# Patient Record
Sex: Female | Born: 1970 | Race: White | Hispanic: No | Marital: Married | Smoking: Former smoker
Health system: Southern US, Community
[De-identification: ages and names within clinical notes are randomized; demographics above are authoritative.]

## PROBLEM LIST (undated history)

## (undated) DIAGNOSIS — K900 Celiac disease: Secondary | ICD-10-CM

## (undated) DIAGNOSIS — D649 Anemia, unspecified: Secondary | ICD-10-CM

## (undated) DIAGNOSIS — J45909 Unspecified asthma, uncomplicated: Secondary | ICD-10-CM

## (undated) DIAGNOSIS — N189 Chronic kidney disease, unspecified: Secondary | ICD-10-CM

## (undated) HISTORY — PX: AUGMENTATION MAMMAPLASTY: SUR837

## (undated) HISTORY — PX: ABDOMINAL HYSTERECTOMY: SHX81

## (undated) HISTORY — PX: OTHER SURGICAL HISTORY: SHX169

---

## 2004-04-14 ENCOUNTER — Ambulatory Visit: Payer: Self-pay | Admitting: Internal Medicine

## 2004-04-29 ENCOUNTER — Ambulatory Visit: Payer: Self-pay | Admitting: Internal Medicine

## 2004-04-30 ENCOUNTER — Ambulatory Visit: Payer: Self-pay | Admitting: Internal Medicine

## 2006-06-10 ENCOUNTER — Ambulatory Visit: Payer: Self-pay | Admitting: Obstetrics and Gynecology

## 2006-06-17 ENCOUNTER — Ambulatory Visit: Payer: Self-pay | Admitting: Obstetrics and Gynecology

## 2007-03-06 ENCOUNTER — Ambulatory Visit: Payer: Self-pay | Admitting: Internal Medicine

## 2007-03-07 ENCOUNTER — Emergency Department: Payer: Self-pay | Admitting: Internal Medicine

## 2008-05-06 ENCOUNTER — Ambulatory Visit: Payer: Self-pay | Admitting: Internal Medicine

## 2008-05-13 ENCOUNTER — Ambulatory Visit: Payer: Self-pay | Admitting: Internal Medicine

## 2008-11-27 ENCOUNTER — Ambulatory Visit: Payer: Self-pay | Admitting: Internal Medicine

## 2010-04-03 ENCOUNTER — Ambulatory Visit: Payer: Self-pay | Admitting: Internal Medicine

## 2010-04-09 ENCOUNTER — Ambulatory Visit: Payer: Self-pay | Admitting: Internal Medicine

## 2010-04-12 ENCOUNTER — Ambulatory Visit: Payer: Self-pay | Admitting: Internal Medicine

## 2011-08-12 ENCOUNTER — Ambulatory Visit: Payer: Self-pay | Admitting: Internal Medicine

## 2012-02-02 ENCOUNTER — Ambulatory Visit: Payer: Self-pay | Admitting: Internal Medicine

## 2012-03-30 ENCOUNTER — Ambulatory Visit: Payer: Self-pay | Admitting: Obstetrics and Gynecology

## 2012-03-30 LAB — BASIC METABOLIC PANEL
Anion Gap: 5 — ABNORMAL LOW (ref 7–16)
BUN: 10 mg/dL (ref 7–18)
Calcium, Total: 8.6 mg/dL (ref 8.5–10.1)
Chloride: 111 mmol/L — ABNORMAL HIGH (ref 98–107)
Co2: 25 mmol/L (ref 21–32)
Creatinine: 0.72 mg/dL (ref 0.60–1.30)
EGFR (African American): >60
EGFR (Non-African Amer.): >60
Glucose: 83 mg/dL (ref 65–99)
Osmolality: 279 (ref 275–301)
Potassium: 4.2 mmol/L (ref 3.5–5.1)
Sodium: 141 mmol/L (ref 136–145)

## 2012-03-30 LAB — HEMOGLOBIN: HGB: 13.5 g/dL (ref 12.0–16.0)

## 2012-03-30 LAB — PREGNANCY, URINE: Pregnancy Test, Urine: NEGATIVE m[IU]/mL

## 2012-04-10 ENCOUNTER — Ambulatory Visit: Payer: Self-pay | Admitting: Obstetrics and Gynecology

## 2012-04-11 LAB — BASIC METABOLIC PANEL
Anion Gap: 5 — ABNORMAL LOW (ref 7–16)
BUN: 7 mg/dL (ref 7–18)
Calcium, Total: 7.6 mg/dL — ABNORMAL LOW (ref 8.5–10.1)
Chloride: 109 mmol/L — ABNORMAL HIGH (ref 98–107)
Co2: 23 mmol/L (ref 21–32)
Creatinine: 0.62 mg/dL (ref 0.60–1.30)
EGFR (African American): >60
EGFR (Non-African Amer.): >60
Glucose: 107 mg/dL — ABNORMAL HIGH (ref 65–99)
Osmolality: 278 (ref 275–301)
Potassium: 4.3 mmol/L (ref 3.5–5.1)
Sodium: 140 mmol/L (ref 136–145)

## 2012-04-11 LAB — HEMATOCRIT: HCT: 33.4 % — ABNORMAL LOW (ref 35.0–47.0)

## 2012-04-11 LAB — PATHOLOGY REPORT

## 2012-08-29 ENCOUNTER — Ambulatory Visit: Payer: Self-pay | Admitting: Internal Medicine

## 2012-11-01 ENCOUNTER — Emergency Department: Payer: Self-pay | Admitting: Internal Medicine

## 2012-11-01 LAB — HEPATIC FUNCTION PANEL A (ARMC)
Albumin: 3.5 g/dL (ref 3.4–5.0)
Alkaline Phosphatase: 87 U/L (ref 50–136)
Bilirubin, Direct: <0.1 mg/dL (ref 0.00–0.20)
Bilirubin,Total: 0.3 mg/dL (ref 0.2–1.0)
SGOT(AST): 26 U/L (ref 15–37)
SGPT (ALT): 25 U/L (ref 12–78)
Total Protein: 6.5 g/dL (ref 6.4–8.2)

## 2012-11-01 LAB — BASIC METABOLIC PANEL
Anion Gap: 6 — ABNORMAL LOW (ref 7–16)
BUN: 12 mg/dL (ref 7–18)
Calcium, Total: 8.5 mg/dL (ref 8.5–10.1)
Chloride: 109 mmol/L — ABNORMAL HIGH (ref 98–107)
Co2: 25 mmol/L (ref 21–32)
Creatinine: 0.79 mg/dL (ref 0.60–1.30)
EGFR (African American): >60
EGFR (Non-African Amer.): >60
Glucose: 87 mg/dL (ref 65–99)
Osmolality: 279 (ref 275–301)
Potassium: 3.7 mmol/L (ref 3.5–5.1)
Sodium: 140 mmol/L (ref 136–145)

## 2012-11-01 LAB — CBC
HCT: 42 % (ref 35.0–47.0)
HGB: 14.3 g/dL (ref 12.0–16.0)
MCH: 28.9 pg (ref 26.0–34.0)
MCHC: 33.9 g/dL (ref 32.0–36.0)
MCV: 85 fL (ref 80–100)
Platelet: 259 10*3/uL (ref 150–440)
RBC: 4.93 10*6/uL (ref 3.80–5.20)
RDW: 14.8 % — ABNORMAL HIGH (ref 11.5–14.5)
WBC: 6.3 10*3/uL (ref 3.6–11.0)

## 2012-11-01 LAB — LIPASE, BLOOD: Lipase: 185 U/L (ref 73–393)

## 2012-11-01 LAB — TROPONIN I
Troponin-I: <0.02 ng/mL
Troponin-I: <0.02 ng/mL

## 2013-10-24 ENCOUNTER — Ambulatory Visit: Payer: Self-pay | Admitting: Internal Medicine

## 2014-06-28 ENCOUNTER — Ambulatory Visit: Payer: Self-pay | Admitting: Physician Assistant

## 2014-07-30 NOTE — Op Note (Signed)
PATIENT NAME:  Jillian Stafford, Jillian Stafford MR#:  [id number] DATE OF BIRTH:  [birth year]  DATE OF PROCEDURE:  04/10/2012  PREOPERATIVE DIAGNOSIS: Menorrhagia.   POSTOPERATIVE DIAGNOSIS:  Menorrhagia.  PROCEDURE:  Laparoscopic bilateral salpingectomy, laparoscopic supracervical hysterectomy.   ANESTHESIA: General endotracheal anesthesia.   SURGEON: Laverta Baltimore, MD  FIRST ASSISTANTAmalia Hailey.  INDICATIONS: This is a 44 year old, gravida 2, para 1 patient with a long history of menorrhagia.   DESCRIPTION OF PROCEDURE: After adequate general endotracheal anesthesia, the patient was placed in dorsal supine position with the legs in the [place] stirrups. Abdomen, perineum and vagina prepped in normal sterile fashion. The patient did receive 2 grams of cefoxitin prior to commencement of the case. Speculum placed in the vagina and the anterior cervix grasped with single-tooth tenaculum and a uterine sound placed in the endometrium and the sound and the single-tooth tenaculum were secured with Steri-Strips.   Gloves were changed and attention directed to patient's abdomen. A 12 mm infraumbilical incision was made. The laparoscope was then advanced into the abdominal cavity under direct  visualization with the Optiview cannula. The patient was placed in Trendelenburg, second port was placed and 3 cm medial to the left anterior iliac spine a #11 port was advanced into the abdominal cavity with direct visualization. A third port placed in a similar position on the right side after injecting with 0.5% Sensorcaine. Attention was directed to the patient's left fallopian tube which was grasped with the fimbriated end and using the Harmonic scalpel the left fallopian tube was removed without difficulty. A similar procedure was repeated on the patient's right fallopian tube. Good hemostasis noted. The uterus was grasped with a tenaculum and the round ligaments were bilaterally clamped, transected with the Harmonic scalpel and  the utero-ovarian ligaments were bilaterally clamped, transected with the Harmonic scalpel. Broad ligament was dissected and each uterine artery was then coagulated with the Kleppingers and transected. Cervix was then transected with the Harmonic scalpel after removing the uterine sound. The endocervical stump was then cauterized with Kleppingers and the rim of the cervical bed also was cauterized to assure good hemostasis. The morcellator was then brought into the abdominal cavity under direct visualization and the uterus was morcellated in the standard fashion. The patient's abdomen was copiously irrigated and suctioned. Good hemostasis noted. Each ureter showed normal peristaltic activity bilaterally. Upper abdomen appear normal. The patient's abdomen was reinspected. The pressure was lowered to 8 mmHg and good hemostasis again was noted. All instruments were then removed after deflating the patient's abdomen.   The incisions were closed with 2 layers for the fascial layer, 2-0 Vicryl suture, and all skin incisions were closed with interrupted 4-0 Vicryl suture. Sterile dressings were applied. Single-tooth tenaculum was then removed and the cervix had a small ooze at the tenaculum site which was controlled with silver nitrate. There were no complications.  Estimated blood 75 mL, intraoperative 1800 mL, urine output 100 mL. Of note, a Foley catheter was placed at commencement of the case and will remain postoperatively.    ____________________________ Boykin Nearing, MD tjs:cs D: 04/10/2012 11:59:00 ET T: 04/10/2012 14:38:03 ET JOB#: [id number]  cc: Boykin Nearing, MD, <Dictator> Boykin Nearing MD ELECTRONICALLY SIGNED 04/10/2012 22:25

## 2014-07-30 NOTE — Discharge Summary (Signed)
PATIENT NAME:  Jillian Stafford, Jillian Stafford MR#:  [id number] DATE OF BIRTH:  10/14/1970  DATE OF ADMISSION:  04/10/2012 DATE OF DISCHARGE:  04/11/2012  PRINCIPAL PROCEDURE: Laparoscopic supracervical hysterectomy with bilateral salpingectomy.   HOSPITAL COURSE: The patient underwent the above procedure which was uncomplicated. On day of surgery, the patient demonstrated decreased urine output but responded well to intravenous fluid bolus. Postop day #1, the patient's vital signs stable, good urine output, hematocrit 33.4%, BUN and creatinine of 7 and 0.62. The patient was without complaints. Dressings were dry. The patient is in adequate pain relief. The patient is discharged to home in good condition.   DISCHARGE MEDICATIONS: Norco 5/325 one to two tablets every 4 hours as needed for pain, Naprosyn 500 mg twice a day as needed for pain, Colace 100 mg daily as needed.   DISCHARGE INSTRUCTIONS: The patient will refrain from sexual activity 30 days. She will follow up with Dr. Ouida Sills in 2 weeks for wound care. The patient is instructed to return before that time if she has increasing nausea or vomiting, increasing abdominal pain, fever, or heavy vaginal bleeding.     ____________________________ Boykin Nearing, MD tjs:es D: 04/11/2012 07:11:33 ET T: 04/11/2012 10:40:25 ET JOB#: [id number]  cc: Boykin Nearing, MD, <Dictator> Boykin Nearing MD ELECTRONICALLY SIGNED 04/11/2012 20:33

## 2014-10-18 ENCOUNTER — Telehealth (HOSPITAL_COMMUNITY): Payer: Self-pay | Admitting: *Deleted

## 2014-10-18 ENCOUNTER — Other Ambulatory Visit: Payer: Self-pay | Admitting: Physician Assistant

## 2014-10-18 DIAGNOSIS — M79601 Pain in right arm: Secondary | ICD-10-CM

## 2014-10-18 DIAGNOSIS — M5030 Other cervical disc degeneration, unspecified cervical region: Secondary | ICD-10-CM

## 2014-10-18 DIAGNOSIS — R202 Paresthesia of skin: Secondary | ICD-10-CM

## 2014-10-18 DIAGNOSIS — M79602 Pain in left arm: Secondary | ICD-10-CM

## 2014-10-24 ENCOUNTER — Ambulatory Visit
Admission: RE | Admit: 2014-10-24 | Discharge: 2014-10-24 | Disposition: A | Discharge status: Home / Self Care | Payer: BLUE CROSS/BLUE SHIELD | Source: Ambulatory Visit | Attending: Physician Assistant | Admitting: Physician Assistant

## 2014-10-24 DIAGNOSIS — M47892 Other spondylosis, cervical region: Secondary | ICD-10-CM | POA: Diagnosis not present

## 2014-10-24 DIAGNOSIS — R202 Paresthesia of skin: Secondary | ICD-10-CM | POA: Diagnosis present

## 2014-10-24 DIAGNOSIS — M79601 Pain in right arm: Secondary | ICD-10-CM | POA: Diagnosis present

## 2014-10-24 DIAGNOSIS — M5022 Other cervical disc displacement, mid-cervical region: Secondary | ICD-10-CM | POA: Diagnosis not present

## 2014-10-24 DIAGNOSIS — M2578 Osteophyte, vertebrae: Secondary | ICD-10-CM | POA: Diagnosis not present

## 2014-10-24 DIAGNOSIS — M79602 Pain in left arm: Secondary | ICD-10-CM

## 2014-10-24 DIAGNOSIS — M4802 Spinal stenosis, cervical region: Secondary | ICD-10-CM | POA: Insufficient documentation

## 2014-10-24 DIAGNOSIS — M5030 Other cervical disc degeneration, unspecified cervical region: Secondary | ICD-10-CM

## 2014-10-24 MED ORDER — GADOBENATE DIMEGLUMINE 529 MG/ML IV SOLN
15.0000 mL | Freq: Once | INTRAVENOUS | Status: AC | PRN
  Administered 2014-10-24: 15 mL via INTRAVENOUS

## 2014-11-25 ENCOUNTER — Other Ambulatory Visit: Payer: Self-pay | Admitting: Neurology

## 2014-11-25 DIAGNOSIS — G629 Polyneuropathy, unspecified: Secondary | ICD-10-CM

## 2014-11-29 ENCOUNTER — Ambulatory Visit: Payer: BLUE CROSS/BLUE SHIELD

## 2014-12-05 ENCOUNTER — Other Ambulatory Visit: Payer: Self-pay | Admitting: Internal Medicine

## 2014-12-05 DIAGNOSIS — Z1231 Encounter for screening mammogram for malignant neoplasm of breast: Secondary | ICD-10-CM

## 2014-12-10 ENCOUNTER — Ambulatory Visit
Admission: RE | Admit: 2014-12-10 | Discharge: 2014-12-10 | Disposition: A | Discharge status: Home / Self Care | Payer: BLUE CROSS/BLUE SHIELD | Source: Ambulatory Visit | Attending: Internal Medicine | Admitting: Internal Medicine

## 2014-12-10 ENCOUNTER — Other Ambulatory Visit: Payer: Self-pay | Admitting: Internal Medicine

## 2014-12-10 DIAGNOSIS — Z9882 Breast implant status: Secondary | ICD-10-CM | POA: Diagnosis not present

## 2014-12-10 DIAGNOSIS — Z1231 Encounter for screening mammogram for malignant neoplasm of breast: Secondary | ICD-10-CM | POA: Insufficient documentation

## 2014-12-12 ENCOUNTER — Other Ambulatory Visit: Payer: Self-pay | Admitting: Internal Medicine

## 2014-12-12 DIAGNOSIS — R928 Other abnormal and inconclusive findings on diagnostic imaging of breast: Secondary | ICD-10-CM

## 2014-12-12 DIAGNOSIS — N6489 Other specified disorders of breast: Secondary | ICD-10-CM

## 2014-12-13 ENCOUNTER — Ambulatory Visit
Admission: RE | Admit: 2014-12-13 | Discharge: 2014-12-13 | Disposition: A | Discharge status: Home / Self Care | Payer: BLUE CROSS/BLUE SHIELD | Source: Ambulatory Visit | Attending: Internal Medicine | Admitting: Internal Medicine

## 2014-12-13 ENCOUNTER — Other Ambulatory Visit: Payer: Self-pay | Admitting: Internal Medicine

## 2014-12-13 DIAGNOSIS — R928 Other abnormal and inconclusive findings on diagnostic imaging of breast: Secondary | ICD-10-CM

## 2014-12-13 DIAGNOSIS — Z9882 Breast implant status: Secondary | ICD-10-CM | POA: Diagnosis not present

## 2014-12-13 DIAGNOSIS — N6489 Other specified disorders of breast: Secondary | ICD-10-CM

## 2015-01-09 ENCOUNTER — Encounter: Payer: Self-pay | Admitting: *Deleted

## 2015-01-10 ENCOUNTER — Ambulatory Visit: Payer: BLUE CROSS/BLUE SHIELD | Admitting: Anesthesiology

## 2015-01-10 ENCOUNTER — Encounter
Admission: RE | Disposition: A | Discharge status: Home / Self Care | Payer: Self-pay | Source: Ambulatory Visit | Attending: Gastroenterology

## 2015-01-10 ENCOUNTER — Ambulatory Visit
Admission: RE | Admit: 2015-01-10 | Discharge: 2015-01-10 | Disposition: A | Discharge status: Home / Self Care | Payer: BLUE CROSS/BLUE SHIELD | Source: Ambulatory Visit | Attending: Gastroenterology | Admitting: Gastroenterology

## 2015-01-10 ENCOUNTER — Encounter: Payer: Self-pay | Admitting: Anesthesiology

## 2015-01-10 DIAGNOSIS — K900 Celiac disease: Secondary | ICD-10-CM | POA: Diagnosis not present

## 2015-01-10 DIAGNOSIS — K219 Gastro-esophageal reflux disease without esophagitis: Secondary | ICD-10-CM | POA: Diagnosis present

## 2015-01-10 DIAGNOSIS — K449 Diaphragmatic hernia without obstruction or gangrene: Secondary | ICD-10-CM | POA: Insufficient documentation

## 2015-01-10 DIAGNOSIS — K529 Noninfective gastroenteritis and colitis, unspecified: Secondary | ICD-10-CM | POA: Diagnosis not present

## 2015-01-10 DIAGNOSIS — Z803 Family history of malignant neoplasm of breast: Secondary | ICD-10-CM | POA: Diagnosis not present

## 2015-01-10 DIAGNOSIS — Z833 Family history of diabetes mellitus: Secondary | ICD-10-CM | POA: Insufficient documentation

## 2015-01-10 DIAGNOSIS — K210 Gastro-esophageal reflux disease with esophagitis: Secondary | ICD-10-CM | POA: Insufficient documentation

## 2015-01-10 DIAGNOSIS — D649 Anemia, unspecified: Secondary | ICD-10-CM | POA: Diagnosis not present

## 2015-01-10 DIAGNOSIS — K2210 Ulcer of esophagus without bleeding: Secondary | ICD-10-CM | POA: Insufficient documentation

## 2015-01-10 DIAGNOSIS — N189 Chronic kidney disease, unspecified: Secondary | ICD-10-CM | POA: Diagnosis not present

## 2015-01-10 DIAGNOSIS — Z79899 Other long term (current) drug therapy: Secondary | ICD-10-CM | POA: Insufficient documentation

## 2015-01-10 DIAGNOSIS — H9192 Unspecified hearing loss, left ear: Secondary | ICD-10-CM | POA: Insufficient documentation

## 2015-01-10 DIAGNOSIS — Z881 Allergy status to other antibiotic agents status: Secondary | ICD-10-CM | POA: Insufficient documentation

## 2015-01-10 DIAGNOSIS — K2950 Unspecified chronic gastritis without bleeding: Secondary | ICD-10-CM | POA: Diagnosis not present

## 2015-01-10 DIAGNOSIS — Z8249 Family history of ischemic heart disease and other diseases of the circulatory system: Secondary | ICD-10-CM | POA: Diagnosis not present

## 2015-01-10 DIAGNOSIS — Z87442 Personal history of urinary calculi: Secondary | ICD-10-CM | POA: Insufficient documentation

## 2015-01-10 DIAGNOSIS — J45909 Unspecified asthma, uncomplicated: Secondary | ICD-10-CM | POA: Diagnosis not present

## 2015-01-10 HISTORY — DX: Chronic kidney disease, unspecified: N18.9

## 2015-01-10 HISTORY — PX: COLONOSCOPY WITH PROPOFOL: SHX5780

## 2015-01-10 HISTORY — DX: Anemia, unspecified: D64.9

## 2015-01-10 HISTORY — PX: ESOPHAGOGASTRODUODENOSCOPY (EGD) WITH PROPOFOL: SHX5813

## 2015-01-10 HISTORY — DX: Unspecified asthma, uncomplicated: J45.909

## 2015-01-10 HISTORY — DX: Celiac disease: K90.0

## 2015-01-10 SURGERY — COLONOSCOPY WITH PROPOFOL
Anesthesia: General

## 2015-01-10 MED ORDER — SODIUM CHLORIDE 0.9 % IV SOLN
INTRAVENOUS | Status: DC
  Administered 2015-01-10: 1000 mL via INTRAVENOUS
  Administered 2015-01-10: 12:00:00 via INTRAVENOUS

## 2015-01-10 MED ORDER — SODIUM CHLORIDE 0.9 % IV SOLN: INTRAVENOUS | Status: DC

## 2015-01-10 MED ORDER — IPRATROPIUM-ALBUTEROL 0.5-2.5 (3) MG/3ML IN SOLN
RESPIRATORY_TRACT | Status: AC
  Administered 2015-01-10: 3 mL
  Filled 2015-01-10: qty 3

## 2015-01-10 MED ORDER — IPRATROPIUM-ALBUTEROL 0.5-2.5 (3) MG/3ML IN SOLN: 3.0000 mL | Freq: Once | RESPIRATORY_TRACT | Status: DC

## 2015-01-10 MED ORDER — PROPOFOL 500 MG/50ML IV EMUL
INTRAVENOUS | Status: DC | PRN
  Administered 2015-01-10: 120 ug/kg/min via INTRAVENOUS

## 2015-01-10 MED ORDER — PHENYLEPHRINE HCL 10 MG/ML IJ SOLN
INTRAMUSCULAR | Status: DC | PRN
  Administered 2015-01-10: 200 ug via INTRAVENOUS
  Administered 2015-01-10 (×2): 100 ug via INTRAVENOUS
  Administered 2015-01-10: 200 ug via INTRAVENOUS
  Administered 2015-01-10 (×2): 100 ug via INTRAVENOUS

## 2015-01-10 MED ORDER — PROPOFOL 10 MG/ML IV BOLUS
INTRAVENOUS | Status: DC | PRN
  Administered 2015-01-10 (×3): 50 mg via INTRAVENOUS

## 2015-01-10 NOTE — Anesthesia Postprocedure Evaluation (Signed)
  Anesthesia Post-op Note  Patient: Jillian Stafford  Procedure(s) Performed: Procedure(s): COLONOSCOPY WITH PROPOFOL (N/A) ESOPHAGOGASTRODUODENOSCOPY (EGD) WITH PROPOFOL (N/A)  Anesthesia type:General  Patient location: PACU  Post pain: Pain level controlled  Post assessment: Post-op Vital signs reviewed, Patient's Cardiovascular Status Stable, Respiratory Function Stable, Patent Airway and No signs of Nausea or vomiting  Post vital signs: Reviewed and stable  Last Vitals:  Filed Vitals:   01/10/15 1227  BP: 102/79  Pulse: 79  Temp:   Resp: 14    Level of consciousness: awake, alert  and patient cooperative  Complications: No apparent anesthesia complications

## 2015-01-10 NOTE — Op Note (Signed)
[facility] Gastroenterology Patient Name: Jillian Stafford Procedure Date: 01/10/2015 11:02 AM MRN: [id number] Account #: [id number] Date of Birth: 03-13-1971 Admit Type: Outpatient Age: 44 Room: [facility] ENDO ROOM 3 Gender: Female Note Status: Finalized Procedure:         Upper GI endoscopy Indications:       Follow-up of celiac disease Providers:         Lollie Sails, MD Referring MD:      Glendon Axe (Referring MD) Medicines:         Monitored Anesthesia Care Complications:     No immediate complications. Procedure:         Pre-Anesthesia Assessment:                    - ASA Grade Assessment: II - A patient with mild systemic                     disease.                    After obtaining informed consent, the endoscope was passed                     under direct vision. Throughout the procedure, the                     patient's blood pressure, pulse, and oxygen saturations                     were monitored continuously. The Endoscope was introduced                     through the mouth, and advanced to the fourth part of                     duodenum. The upper GI endoscopy was accomplished without                     difficulty. The patient tolerated the procedure well. Findings:      LA Grade A (one or more mucosal breaks less than 5 mm, not extending       between tops of 2 mucosal folds) esophagitis was found. Biopsies were       taken with a cold forceps for histology.      The exam of the esophagus was otherwise normal.      Diffuse mildly erythematous mucosa without bleeding was found in the       gastric body. Biopsies were taken with a cold forceps for histology.      A small hiatus hernia was found. The Z-line was a variable distance from       incisors; the hiatal hernia was sliding.      Diffuse mucosal flattening was found in the entire examined duodenum.       Biopsies for histology were taken with a cold forceps for for evaluation       of  celiac disease. Impression:        - LA Grade A erosive esophagitis. Biopsied.                    - Erythematous mucosa in the gastric body. Biopsied.                    - Small hiatus hernia.                    -  Flattened mucosa was found in the duodenum, consistent                     with celiac disease. Biopsied. Recommendation:    - Use Prilosec (omeprazole) 20 mg PO daily daily.                    - Await pathology results.                    - Return to GI clinic in 4 weeks. Procedure Code(s): --- Professional ---                    [phone], Esophagogastroduodenoscopy, flexible, transoral;                     with biopsy, single or multiple Diagnosis Code(s): --- Professional ---                    530.19, Other esophagitis                    537.89, Other specified disorders of stomach and duodenum                    537.9, Unspecified disorder of stomach and duodenum                    579.0, Celiac disease                    553.3, Diaphragmatic hernia without mention of obstruction                     or gangrene CPT copyright 2014 American Medical Association. All rights reserved. The codes documented in this report are preliminary and upon coder review may  be revised to meet current compliance requirements. Lollie Sails, MD 01/10/2015 11:24:31 AM This report has been signed electronically. Number of Addenda: 0 Note Initiated On: 01/10/2015 11:02 AM      [facility]

## 2015-01-10 NOTE — Op Note (Signed)
[facility] Gastroenterology Patient Name: Jillian Stafford Procedure Date: 01/10/2015 11:01 AM MRN: [id number] Account #: [id number] Date of Birth: 17-May-1970 Admit Type: Outpatient Age: 44 Room: [facility] ENDO ROOM 3 Gender: Female Note Status: Finalized Procedure:         Colonoscopy Indications:       Chronic diarrhea Providers:         Lollie Sails, MD Referring MD:      Glendon Axe (Referring MD) Medicines:         Monitored Anesthesia Care Complications:     No immediate complications. Procedure:         Pre-Anesthesia Assessment:                    - ASA Grade Assessment: II - A patient with mild systemic                     disease.                    After obtaining informed consent, the colonoscope was                     passed under direct vision. Throughout the procedure, the                     patient's blood pressure, pulse, and oxygen saturations                     were monitored continuously. The Colonoscope was                     introduced through the anus and advanced to the the cecum,                     identified by appendiceal orifice and ileocecal valve. The                     colonoscopy was unusually difficult due to poor bowel                     prep. Successful completion of the procedure was aided by                     lavage. The patient tolerated the procedure well. Findings:      The colon (entire examined portion) appeared normal. Biopsies for       histology were taken with a cold forceps from the right colon and left       colon for evaluation of microscopic colitis.      The digital rectal exam was normal. Impression:        - The entire examined colon is normal. Biopsied. Recommendation:    - Discharge patient to home. Procedure Code(s): --- Professional ---                    [phone], Colonoscopy, flexible; with biopsy, single or                     multiple Diagnosis Code(s): --- Professional ---  787.91, Diarrhea CPT copyright 2014 American Medical Association. All rights reserved. The codes documented in this report are preliminary and upon coder review may  be revised to meet current compliance requirements. Lollie Sails, MD 01/10/2015 11:53:37 AM This report has been signed electronically. Number of Addenda:  0 Note Initiated On: 01/10/2015 11:01 AM Scope Withdrawal Time: 0 hours 9 minutes 2 seconds  Total Procedure Duration: 0 hours 24 minutes 15 seconds       [facility]

## 2015-01-10 NOTE — Transfer of Care (Signed)
Immediate Anesthesia Transfer of Care Note  Patient: Jillian Stafford  Procedure(s) Performed: Procedure(s): COLONOSCOPY WITH PROPOFOL (N/A) ESOPHAGOGASTRODUODENOSCOPY (EGD) WITH PROPOFOL (N/A)  Patient Location: PACU  Anesthesia Type:General  Level of Consciousness: awake, alert  and oriented  Airway & Oxygen Therapy: Patient Spontanous Breathing and Patient connected to nasal cannula oxygen  Post-op Assessment: Post -op Vital signs reviewed and stable  Post vital signs: Reviewed  Last Vitals:  Filed Vitals:   01/10/15 1008  BP: 97/67  Pulse: 90  Temp: 36.5 C  Resp: 16    Complications: No apparent anesthesia complications

## 2015-01-10 NOTE — Anesthesia Preprocedure Evaluation (Addendum)
Anesthesia Evaluation  Patient identified by MRN, date of birth, ID band Patient awake    Reviewed: Allergy & Precautions, H&P , NPO status , Patient's Chart, lab work & pertinent test results  History of Anesthesia Complications (+) PROLONGED EMERGENCE and history of anesthetic complications  Airway Mallampati: II  TM Distance: >3 FB Neck ROM: full    Dental no notable dental hx. (+) Teeth Intact   Pulmonary neg shortness of breath, asthma ,     + wheezing      Cardiovascular Exercise Tolerance: Good Normal cardiovascular exam Rhythm:regular Rate:Normal     Neuro/Psych negative neurological ROS  negative psych ROS   GI/Hepatic negative GI ROS, Neg liver ROS,   Endo/Other  negative endocrine ROS  Renal/GU Renal disease  negative genitourinary   Musculoskeletal   Abdominal   Peds  Hematology negative hematology ROS (+) anemia ,   Anesthesia Other Findings Past Medical History:   Anemia                                                       Asthma                                                       Celiac disease                                               Chronic kidney disease                                       Reproductive/Obstetrics negative OB ROS                             Anesthesia Physical Anesthesia Plan  ASA: III  Anesthesia Plan: General   Post-op Pain Management:    Induction:   Airway Management Planned:   Additional Equipment:   Intra-op Plan:   Post-operative Plan:   Informed Consent: I have reviewed the patients History and Physical, chart, labs and discussed the procedure including the risks, benefits and alternatives for the proposed anesthesia with the patient or authorized representative who has indicated his/her understanding and acceptance.   Dental Advisory Given  Plan Discussed with: Anesthesiologist, CRNA and Surgeon  Anesthesia Plan  Comments: (Plan to pre treat with duoneb treatment )        Anesthesia Quick Evaluation

## 2015-01-10 NOTE — H&P (Signed)
Outpatient short stay form Pre-procedure 01/10/2015 10:58 AM Lollie Sails MD  Primary Physician: Dr. Glendon Axe  Reason for visit:  EGD and colonoscopy  History of present illness:  Patient is a 44 year old female presenting today for a EGD and colonoscopy. She has a history of celiac disease. She still has some dietary indiscretions and will have some loose stools. However stools quite frequently.  She tolerated her prep well. She takes no anticoagulation medications or aspirin products.    Current facility-administered medications:  .  0.9 %  sodium chloride infusion, , Intravenous, Continuous, Lollie Sails, MD, Last Rate: 20 mL/hr at 01/10/15 1031, 1,000 mL at 01/10/15 1031 .  0.9 %  sodium chloride infusion, , Intravenous, Continuous, Lollie Sails, MD .  ipratropium-albuterol (DUONEB) 0.5-2.5 (3) MG/3ML nebulizer solution 3 mL, 3 mL, Nebulization, Once, Andria Frames, MD  Prescriptions prior to admission  Medication Sig Dispense Refill Last Dose  . Cyanocobalamin (VITAMIN B 12 PO) Take 1,000 mg by mouth.   Past Week at Unknown time  . diphenhydrAMINE (BENADRYL) 25 mg capsule Take 25 mg by mouth every 6 (six) hours as needed.   Past Week at Unknown time  . loperamide (IMODIUM) 2 MG capsule Take by mouth as needed for diarrhea or loose stools.   Past Week at Unknown time     Not on File   Past Medical History  Diagnosis Date  . Anemia   . Asthma   . Celiac disease   . Chronic kidney disease     Review of systems:      Physical Exam    Heart and lungs: Regular rate and rhythm without rub or gallop, lungs are bilaterally clear    HEENT: Normocephalic atraumatic eyes are anicteric    Other:     Pertinant exam for procedure: Soft nontender nondistended bowel sounds positive normoactive    Planned proceedures: EGD and colonoscopy with indicated procedures I have discussed the risks benefits and complications of procedures to include not limited  to bleeding, infection, perforation and the risk of sedation and the patient wishes to proceed.    Lollie Sails, MD Gastroenterology 01/10/2015  10:58 AM

## 2015-01-13 LAB — SURGICAL PATHOLOGY

## 2015-01-16 ENCOUNTER — Other Ambulatory Visit: Payer: Self-pay | Admitting: Gastroenterology

## 2015-01-16 DIAGNOSIS — R197 Diarrhea, unspecified: Secondary | ICD-10-CM

## 2015-01-16 DIAGNOSIS — K900 Celiac disease: Secondary | ICD-10-CM

## 2015-01-21 ENCOUNTER — Ambulatory Visit
Admission: RE | Admit: 2015-01-21 | Discharge: 2015-01-21 | Disposition: A | Discharge status: Home / Self Care | Payer: BLUE CROSS/BLUE SHIELD | Source: Ambulatory Visit | Attending: Gastroenterology | Admitting: Gastroenterology

## 2015-01-21 DIAGNOSIS — R197 Diarrhea, unspecified: Secondary | ICD-10-CM

## 2015-01-21 DIAGNOSIS — K900 Celiac disease: Secondary | ICD-10-CM

## 2015-01-31 ENCOUNTER — Encounter: Payer: Self-pay | Admitting: Gastroenterology

## 2015-02-05 ENCOUNTER — Encounter: Payer: Self-pay | Admitting: Dietician

## 2015-02-05 ENCOUNTER — Encounter: Payer: BLUE CROSS/BLUE SHIELD | Attending: Gastroenterology | Admitting: Dietician

## 2015-02-05 VITALS — Ht 68.0 in | Wt 174.1 lb

## 2015-02-05 DIAGNOSIS — K900 Celiac disease: Secondary | ICD-10-CM | POA: Diagnosis not present

## 2015-02-05 NOTE — Patient Instructions (Signed)
   Work to eat 2-3 "balanced" snacks during the day; with some gluten free starch or fruit or vegetable and protein.   Ok to have a nutrition or protein drink such as Pharmacist, hospital for one meal daily.   Focus on rice, corn, potatoes as main sources of starch.   Goal for fluids is about 64oz daily on average. Work to add 8-16oz of fluids in your day, gradually building up to the goal.

## 2015-02-05 NOTE — Progress Notes (Signed)
Medical Nutrition Therapy: Visit start time: 1630  end time: 1730  Assessment:  Diagnosis: celiac Past medical history: asthma, GERD Psychosocial issues/ stress concerns: patient reports moderate stress level Preferred learning method:  . Hands-on   Current weight: 174.1lbs  Height: 5'8" Medications, supplements: reviewed list in chart with patient. She reports using 2 inhalers as needed, but could not recall exact names/concentrations.  Progress and evaluation: Patient reports working to follow gluten free diet for 1- 1.5 years since diagnosis of celiac disease.         She was having symptoms of diarrhea prior to diagnosis.          She has little time for preparing meals at home, and voices difficulty planning gluten free balanced meals.           She reports long history of eating 1-2 meals daily, sometimes going most of the day with very little to eat.   Physical activity: no regular activity   Dietary Intake:  Usual eating pattern includes 1-2 meals and 1-2 snacks per day. Dining out frequency: 3 meals per week.  Breakfast: usually none Snack: gluten free crackers Lunch: usually none Snack: maybe gf crackers Supper: gf pizza, usually quick meal Snack: none Beverages: coffee in am at work 1lg cup with splenda; some water, not much fluid intake  Nutrition Care Education: Topics covered: gf diet Basic nutrition: basic food groups, appropriate nutrient balance, appropriate meal and snack schedule, importance of adequate fluids Gluten free diet: sources of gluten free starches, options for balanced meals and snacks Other lifestyle changes:  identifying habits that need to change, options for increasing physical activity such as increasing daily steps or short bouts of activity.   Nutritional Diagnosis:  [place]-2.1 Inpaired nutrition utilization As related to celiac disease.  As evidenced by GI symptoms. NI-5.11.1 Predicted suboptimal nutrient intake As related to skipped meals, low  fluid intake.  As evidenced by patient report.  Intervention: Instruction as noted above.   Advised including healthy snacks during the day rather than full meals if needed to ensure adequate nutrient intake.   Discussed strategies for increasing fluid intake and planning ahead for balanced meals.    Modified pre-printed menus to avoid gluten, to provide options for easy balanced meals.    Patient declined scheduling follow-up at this time.   Education Materials given:  Marland Kitchen Sample meal pattern/ menus: Quick and Healthy Meal Ideas (modified) . Gluten Free Guide for Families  Gluten Free Snack food list . Goals/ instructions   Learner/ who was taught:  . Patient   Level of understanding: Marland Kitchen Verbalizes/ demonstrates competency  Demonstrated degree of understanding via:   Teach back Learning barriers: . None   Willingness to learn/ readiness for change: . Acceptance, ready for change   Monitoring and Evaluation:  Dietary intake, exercise, gluten free diet compliance, and body weight      follow up: prn

## 2015-07-17 ENCOUNTER — Other Ambulatory Visit: Payer: Self-pay | Admitting: Physician Assistant

## 2015-07-17 DIAGNOSIS — N644 Mastodynia: Secondary | ICD-10-CM

## 2015-07-18 ENCOUNTER — Other Ambulatory Visit: Payer: Self-pay | Admitting: Physician Assistant

## 2015-07-18 ENCOUNTER — Ambulatory Visit
Admission: RE | Admit: 2015-07-18 | Discharge: 2015-07-18 | Disposition: A | Discharge status: Home / Self Care | Payer: Managed Care, Other (non HMO) | Source: Ambulatory Visit | Attending: Physician Assistant | Admitting: Physician Assistant

## 2015-07-18 DIAGNOSIS — Z9882 Breast implant status: Secondary | ICD-10-CM | POA: Insufficient documentation

## 2015-07-18 DIAGNOSIS — N644 Mastodynia: Secondary | ICD-10-CM

## 2015-07-22 ENCOUNTER — Other Ambulatory Visit: Payer: Self-pay | Admitting: Gastroenterology

## 2015-07-22 DIAGNOSIS — K900 Celiac disease: Secondary | ICD-10-CM

## 2015-07-30 ENCOUNTER — Ambulatory Visit: Payer: BLUE CROSS/BLUE SHIELD

## 2015-07-31 ENCOUNTER — Ambulatory Visit: Payer: BLUE CROSS/BLUE SHIELD

## 2016-05-24 ENCOUNTER — Other Ambulatory Visit: Payer: Self-pay | Admitting: Physician Assistant

## 2016-05-24 DIAGNOSIS — Z1231 Encounter for screening mammogram for malignant neoplasm of breast: Secondary | ICD-10-CM

## 2016-06-25 ENCOUNTER — Ambulatory Visit
Admission: RE | Admit: 2016-06-25 | Discharge: 2016-06-25 | Disposition: A | Discharge status: Home / Self Care | Payer: Managed Care, Other (non HMO) | Source: Ambulatory Visit | Attending: Physician Assistant | Admitting: Physician Assistant

## 2016-06-25 DIAGNOSIS — Z1231 Encounter for screening mammogram for malignant neoplasm of breast: Secondary | ICD-10-CM

## 2016-07-30 IMAGING — MG MM DIAG BREAST W/IMPLANT TOMO UNI R
6 of 9 series · 6 of 21 positions shown · non-contrast
Comparison: Previous exam(s).

CLINICAL DATA: Further evaluation of posterior medial asymmetry

EXAM:
DIGITAL DIAGNOSTIC RIGHT MAMMOGRAM WITH IMPLANTS, 3D TOMOSYNTHESIS
WITH CAD
The patient has retropectoral implants. Standard and implant
displaced views were performed.

[R XCCL]
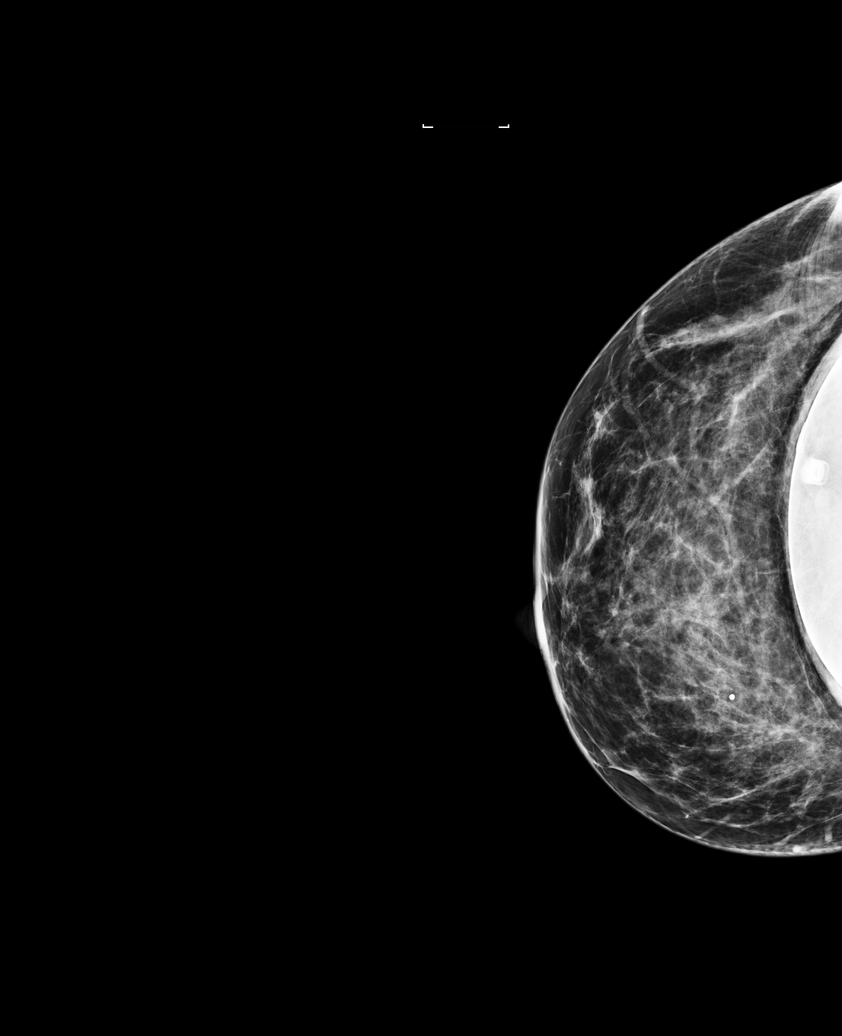

[R XCCL synth-2D]
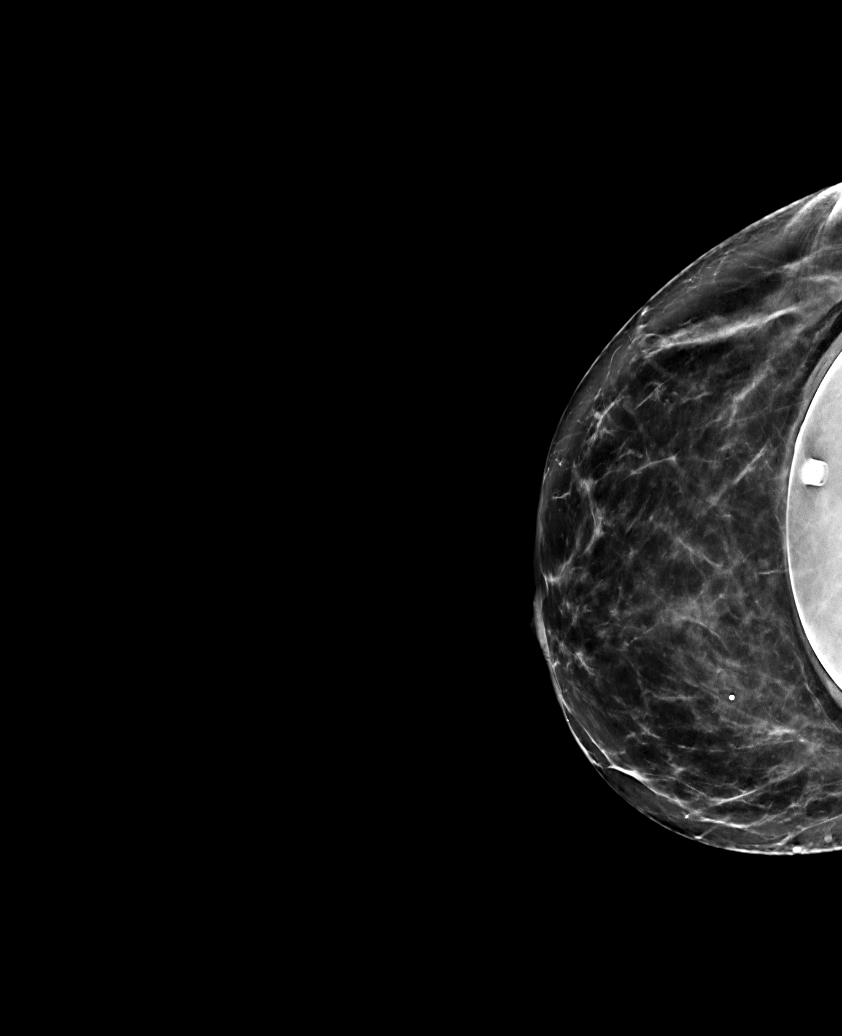

[R MLO]
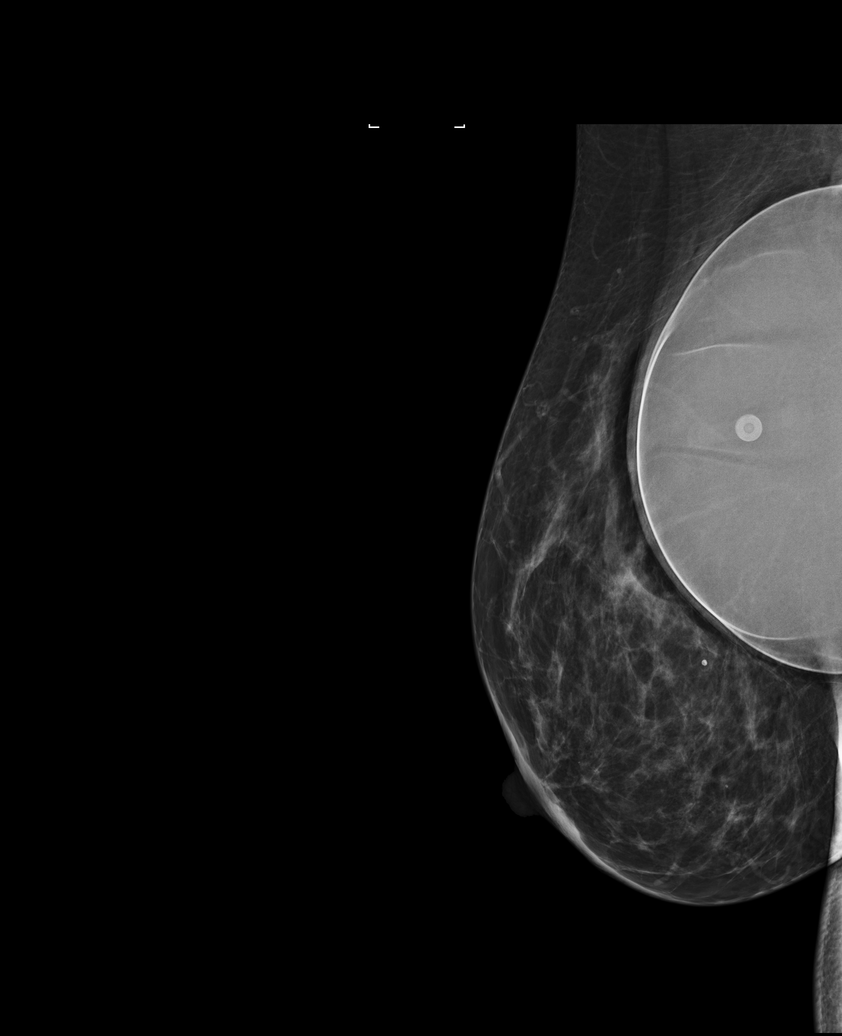

[R CC]
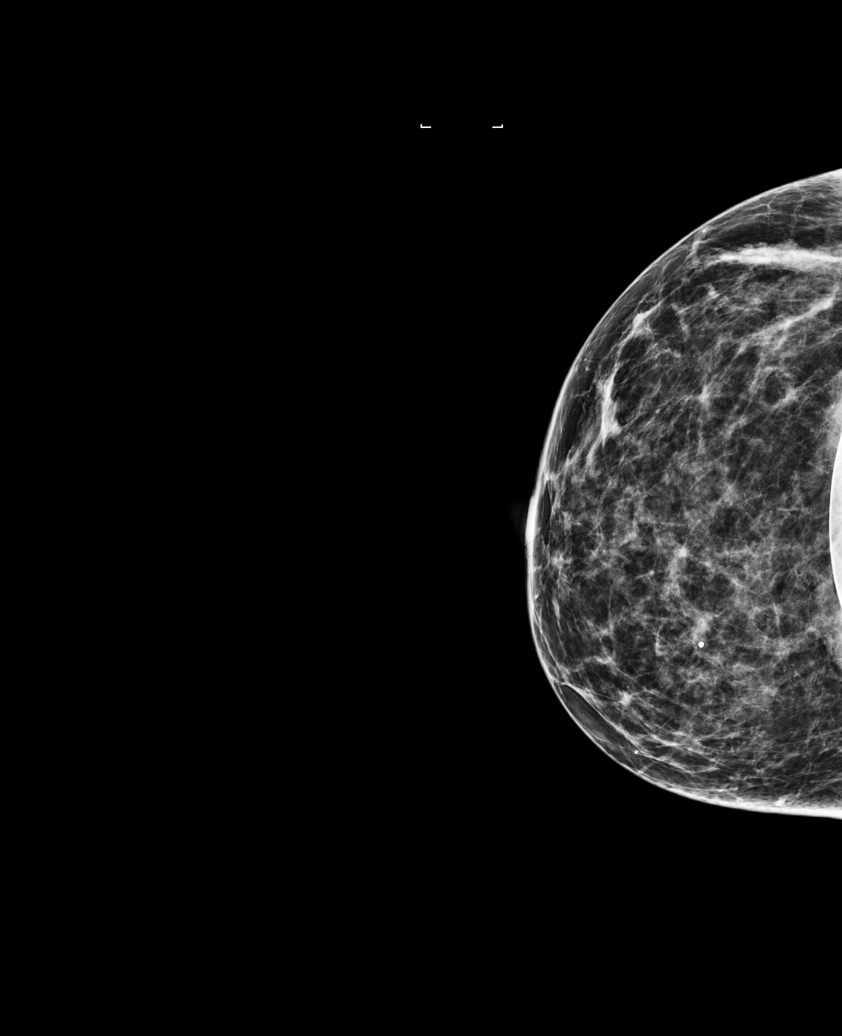

[R MLO synth-2D]
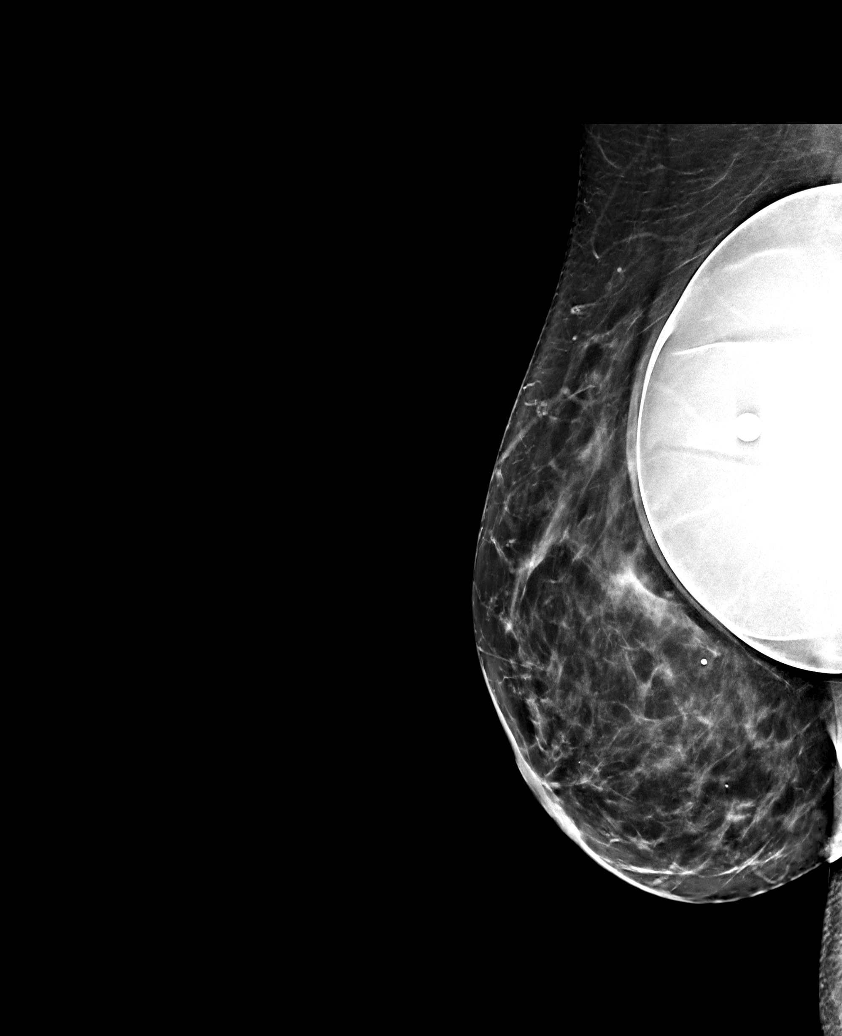

[R CC synth-2D]
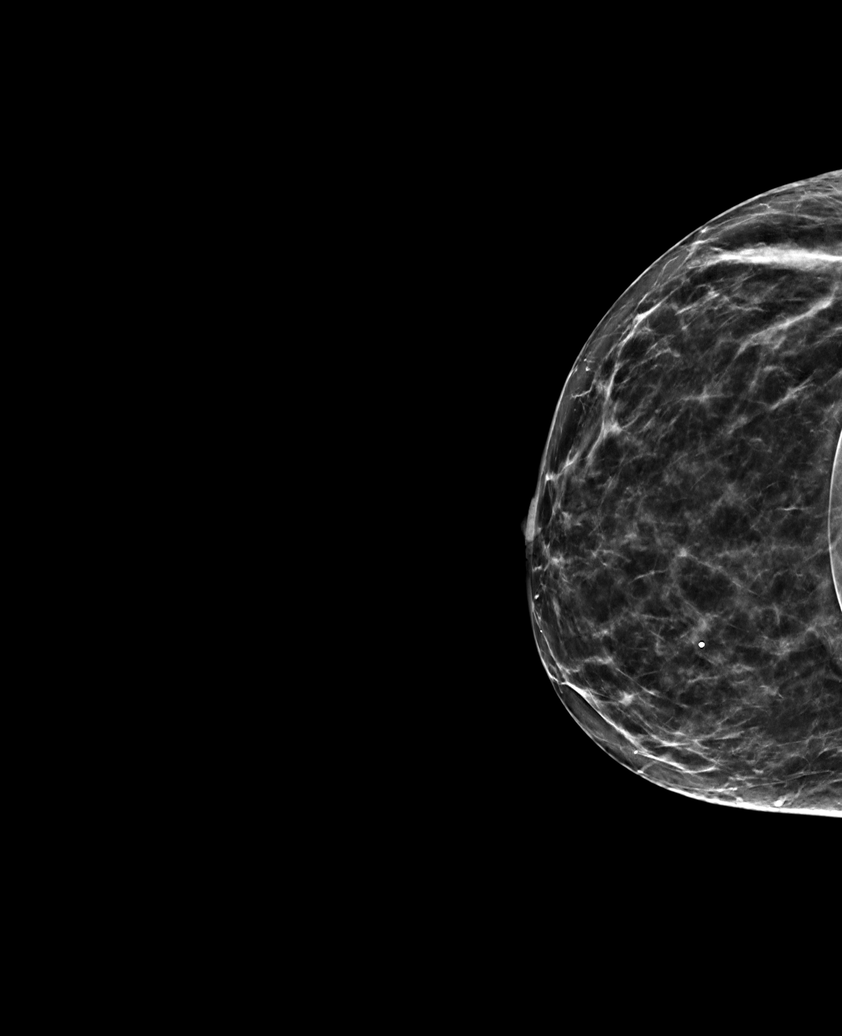

[6 of 21 positions shown; findings below may reference images not displayed]

ACR Breast Density Category c: The breast tissue is heterogeneously
dense, which may obscure small masses.
FINDINGS: There are no persistent abnormalities on tomographic images.

Mammographic images were processed with CAD.
IMPRESSION: Negative

RECOMMENDATION:
Screening mammogram in 1 year

I have discussed the findings and recommendations with the patient.
Results were also provided in writing at the conclusion of the
visit. If applicable, a reminder letter will be sent to the patient
regarding the next appointment.

BI-RADS CATEGORY  1: Negative.

## 2017-06-27 ENCOUNTER — Other Ambulatory Visit: Payer: Self-pay | Admitting: Obstetrics and Gynecology

## 2017-06-27 DIAGNOSIS — Z1231 Encounter for screening mammogram for malignant neoplasm of breast: Secondary | ICD-10-CM

## 2017-08-15 ENCOUNTER — Ambulatory Visit
Admission: RE | Admit: 2017-08-15 | Discharge: 2017-08-15 | Disposition: A | Discharge status: Home / Self Care | Payer: 59 | Source: Ambulatory Visit | Attending: Obstetrics and Gynecology | Admitting: Obstetrics and Gynecology

## 2017-08-15 DIAGNOSIS — Z1231 Encounter for screening mammogram for malignant neoplasm of breast: Secondary | ICD-10-CM | POA: Diagnosis present

## 2018-05-18 ENCOUNTER — Ambulatory Visit (INDEPENDENT_AMBULATORY_CARE_PROVIDER_SITE_OTHER): Payer: Managed Care, Other (non HMO) | Admitting: Nurse Practitioner

## 2018-05-18 ENCOUNTER — Encounter (INDEPENDENT_AMBULATORY_CARE_PROVIDER_SITE_OTHER): Payer: Self-pay | Admitting: Nurse Practitioner

## 2018-05-18 VITALS — BP 114/76 | HR 82 | Resp 14 | Ht 68.0 in | Wt 179.4 lb

## 2018-05-18 DIAGNOSIS — I8311 Varicose veins of right lower extremity with inflammation: Secondary | ICD-10-CM | POA: Diagnosis not present

## 2018-05-18 DIAGNOSIS — J45909 Unspecified asthma, uncomplicated: Secondary | ICD-10-CM

## 2018-05-23 ENCOUNTER — Encounter (INDEPENDENT_AMBULATORY_CARE_PROVIDER_SITE_OTHER): Payer: Self-pay | Admitting: Nurse Practitioner

## 2018-05-23 DIAGNOSIS — I8311 Varicose veins of right lower extremity with inflammation: Secondary | ICD-10-CM | POA: Insufficient documentation

## 2018-05-23 DIAGNOSIS — J45909 Unspecified asthma, uncomplicated: Secondary | ICD-10-CM | POA: Insufficient documentation

## 2018-05-23 NOTE — Progress Notes (Signed)
Subjective:    Patient ID: Jillian Stafford, female    DOB: 11/22/1970, 48 y.o.   MRN: [id number] Chief Complaint  Patient presents with  . Follow-up    HPI  Jillian Stafford is a 48 y.o. female The patient is seen for evaluation of symptomatic varicose veins. The patient relates burning and stinging which worsened steadily throughout the course of the day, particularly with standing. The patient also notes an aching and throbbing pain over the varicosities, particularly with prolonged dependent positions. The symptoms are significantly improved with elevation.  The patient also notes that during hot weather the symptoms are greatly intensified. The patient states the pain from the varicose veins interferes with work, daily exercise, shopping and household maintenance. At this point, the symptoms are persistent and severe enough that they're having a negative impact on lifestyle and are interfering with daily activities.  There is no history of DVT, PE or superficial thrombophlebitis. There is no history of ulceration or hemorrhage. The patient denies a significant family history of varicose veins.   The patient states that she has worn graduated compression stockings in the past although not on a regular basis.  The patient has had sclerotherapy on her right lower extremity before however it helped for a time and now the problem is recurring.  She also states that she had some sort of laser procedure but it was not invasive it was more topical she described.   Past Medical History:  Diagnosis Date  . Anemia   . Asthma   . Celiac disease   . Chronic kidney disease     Past Surgical History:  Procedure Laterality Date  . Abdomin0plastyl     . ABDOMINAL HYSTERECTOMY    . AUGMENTATION MAMMAPLASTY     2001  . COLONOSCOPY WITH PROPOFOL N/A 01/10/2015   Procedure: COLONOSCOPY WITH PROPOFOL;  Surgeon: Lollie Sails, MD;  Location: [facility] ENDOSCOPY;  Service: Endoscopy;  Laterality: N/A;    . ear surgery left Left   . ESOPHAGOGASTRODUODENOSCOPY (EGD) WITH PROPOFOL N/A 01/10/2015   Procedure: ESOPHAGOGASTRODUODENOSCOPY (EGD) WITH PROPOFOL;  Surgeon: Lollie Sails, MD;  Location: [facility] ENDOSCOPY;  Service: Endoscopy;  Laterality: N/A;    Social History   Socioeconomic History  . Marital status: Single    Spouse name: Not on file  . Number of children: Not on file  . Years of education: Not on file  . Highest education level: Not on file  Occupational History  . Not on file  Social Needs  . Financial resource strain: Not on file  . Food insecurity:    Worry: Not on file    Inability: Not on file  . Transportation needs:    Medical: Not on file    Non-medical: Not on file  Tobacco Use  . Smoking status: Former Smoker    Last attempt to quit: 02/04/2010    Years since quitting: 8.3  . Smokeless tobacco: Never Used  Substance and Sexual Activity  . Alcohol use: Yes    Alcohol/week: 2.0 - 3.0 standard drinks    Types: 2 - 3 Standard drinks or equivalent per week  . Drug use: Not on file  . Sexual activity: Yes    Birth control/protection: Other-see comments  Lifestyle  . Physical activity:    Days per week: Not on file    Minutes per session: Not on file  . Stress: Not on file  Relationships  . Social connections:    Talks on phone:  Not on file    Gets together: Not on file    Attends religious service: Not on file    Active member of club or organization: Not on file    Attends meetings of clubs or organizations: Not on file    Relationship status: Not on file  . Intimate partner violence:    Fear of current or ex partner: Not on file    Emotionally abused: Not on file    Physically abused: Not on file    Forced sexual activity: Not on file  Other Topics Concern  . Not on file  Social History Narrative  . Not on file    Family History  Problem Relation Age of Onset  . Breast cancer Mother 7    Allergies  Allergen Reactions  .  Clarithromycin Nausea And Vomiting     Review of Systems   Review of Systems: Negative Unless Checked Constitutional: [] Weight loss  [] Fever  [] Chills Cardiac: [] Chest pain   []  Atrial Fibrillation  [] Palpitations   [] Shortness of breath when laying flat   [] Shortness of breath with exertion. [] Shortness of breath at rest Vascular:  [] Pain in legs with walking   [] Pain in legs with standing [] Pain in legs when laying flat   [] Claudication    [] Pain in feet when laying flat    [] History of DVT   [] Phlebitis   [] Swelling in legs   [x] Varicose veins   [] Non-healing ulcers Pulmonary:   [] Uses home oxygen   [] Productive cough   [] Hemoptysis   [] Wheeze  [] COPD   [] Asthma Neurologic:  [] Dizziness   [] Seizures  [] Blackouts [] History of stroke   [] History of TIA  [] Aphasia   [] Temporary Blindness   [] Weakness or numbness in arm   [] Weakness or numbness in leg Musculoskeletal:   [] Joint swelling   [] Joint pain   [] Low back pain  []  History of Knee Replacement [] Arthritis [] back Surgeries  []  Spinal Stenosis    Hematologic:  [] Easy bruising  [] Easy bleeding   [] Hypercoagulable state   [] Anemic Gastrointestinal:  [] Diarrhea   [] Vomiting  [] Gastroesophageal reflux/heartburn   [] Difficulty swallowing. [] Abdominal pain Genitourinary:  [] Chronic kidney disease   [] Difficult urination  [] Anuric   [] Blood in urine [] Frequent urination  [] Burning with urination   [] Hematuria Skin:  [] Rashes   [] Ulcers [] Wounds Psychological:  [] History of anxiety   []  History of major depression  []  Memory Difficulties     Objective:   Physical Exam  BP 114/76 (BP Location: Right Arm, Patient Position: Sitting)   Pulse 82   Resp 14   Ht 5\' 8"  (1.727 m)   Wt 179 lb 6.4 oz (81.4 kg)   BMI 27.28 kg/m   Gen: WD/WN, NAD Head: [place]/AT, No temporalis wasting.  Ear/Nose/Throat: Hearing grossly intact, nares w/o erythema or drainage Eyes: PER, EOMI, sclera nonicteric.  Neck: Supple, no masses.  No JVD.  Pulmonary:  Good air  movement, no use of accessory muscles.  Cardiac: RRR Vascular:  Vessel Right Left  Radial Palpable Palpable  Dorsalis Pedis Palpable Palpable  Posterior Tibial Palpable Palpable   Gastrointestinal: soft, non-distended. No guarding/no peritoneal signs.  Musculoskeletal: M/S 5/5 throughout.  No deformity or atrophy.  Neurologic: Pain and light touch intact in extremities.  Symmetrical.  Speech is fluent. Motor exam as listed above. Psychiatric: Judgment intact, Mood & affect appropriate for pt's clinical situation. Dermatologic: No Venous rashes. No Ulcers Noted.  No changes consistent with cellulitis. Lymph : No Cervical lymphadenopathy, no lichenification or skin  changes of chronic lymphedema.      Assessment & Plan:  1. Varicose veins of right lower extremity with inflammation  Recommend:  The patient has large symptomatic varicose veins that are painful and associated with swelling.  I have had a long discussion with the patient regarding  varicose veins and why they cause symptoms.  Patient will begin wearing graduated compression stockings class 1 on a daily basis, beginning first thing in the morning and removing them in the evening. The patient is instructed specifically not to sleep in the stockings.    The patient  will also begin using over-the-counter analgesics such as Motrin 600 mg po TID to help control the symptoms.    In addition, behavioral modification including elevation during the day will be initiated.    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  I informed the patient that if her office is able to perform a venous reflux study that they could fax the results to [place].  If her office is unable to do a venous reflux study we would be happy to bring her in to perform a study in 3 months.  Further plans will be based on the ultrasound results and whether conservative therapies are successful at eliminating the pain and  swelling.   2. Asthma without status asthmaticus without complication, unspecified asthma severity, unspecified whether persistent Continue pulmonary medications and aerosols as already ordered, these medications have been reviewed and there are no changes at this time.     Current Outpatient Medications on File Prior to Visit  Medication Sig Dispense Refill  . Cyanocobalamin (VITAMIN B 12 PO) Take 1,000 mg by mouth.    . diphenhydrAMINE (BENADRYL) 25 mg capsule Take 25 mg by mouth every 6 (six) hours as needed.    . loperamide (IMODIUM) 2 MG capsule Take by mouth as needed for diarrhea or loose stools.    . pantoprazole (PROTONIX) 20 MG tablet Take 20 mg by mouth daily.     No current facility-administered medications on file prior to visit.     There are no Patient Instructions on file for this visit. No follow-ups on file.   Kris Hartmann, NP  This note was completed with Sales executive.  Any errors are purely unintentional.

## 2018-08-02 ENCOUNTER — Other Ambulatory Visit: Payer: Self-pay | Admitting: Obstetrics and Gynecology

## 2018-08-02 ENCOUNTER — Other Ambulatory Visit: Payer: Self-pay | Admitting: Physician Assistant

## 2018-08-02 DIAGNOSIS — Z1231 Encounter for screening mammogram for malignant neoplasm of breast: Secondary | ICD-10-CM

## 2018-09-19 ENCOUNTER — Ambulatory Visit
Admission: RE | Admit: 2018-09-19 | Discharge: 2018-09-19 | Disposition: A | Discharge status: Home / Self Care | Payer: Managed Care, Other (non HMO) | Source: Ambulatory Visit | Attending: Physician Assistant | Admitting: Physician Assistant

## 2018-09-19 ENCOUNTER — Other Ambulatory Visit: Payer: Self-pay

## 2018-09-19 DIAGNOSIS — Z1231 Encounter for screening mammogram for malignant neoplasm of breast: Secondary | ICD-10-CM | POA: Diagnosis present

## 2019-09-03 ENCOUNTER — Other Ambulatory Visit: Payer: Self-pay | Admitting: Physician Assistant

## 2019-09-03 DIAGNOSIS — Z1231 Encounter for screening mammogram for malignant neoplasm of breast: Secondary | ICD-10-CM

## 2019-09-20 ENCOUNTER — Ambulatory Visit
Admission: RE | Admit: 2019-09-20 | Discharge: 2019-09-20 | Disposition: A | Discharge status: Home / Self Care | Payer: Managed Care, Other (non HMO) | Source: Ambulatory Visit | Attending: Physician Assistant | Admitting: Physician Assistant

## 2019-09-20 DIAGNOSIS — Z1231 Encounter for screening mammogram for malignant neoplasm of breast: Secondary | ICD-10-CM

## 2020-08-07 ENCOUNTER — Encounter: Payer: Self-pay | Admitting: Dermatology

## 2020-08-07 ENCOUNTER — Other Ambulatory Visit: Payer: Self-pay

## 2020-08-07 ENCOUNTER — Ambulatory Visit: Payer: Managed Care, Other (non HMO) | Admitting: Dermatology

## 2020-08-07 DIAGNOSIS — L578 Other skin changes due to chronic exposure to nonionizing radiation: Secondary | ICD-10-CM

## 2020-08-07 DIAGNOSIS — D489 Neoplasm of uncertain behavior, unspecified: Secondary | ICD-10-CM

## 2020-08-07 DIAGNOSIS — L821 Other seborrheic keratosis: Secondary | ICD-10-CM | POA: Diagnosis not present

## 2020-08-07 DIAGNOSIS — D225 Melanocytic nevi of trunk: Secondary | ICD-10-CM | POA: Diagnosis not present

## 2020-08-07 DIAGNOSIS — Z85828 Personal history of other malignant neoplasm of skin: Secondary | ICD-10-CM

## 2020-08-07 DIAGNOSIS — D239 Other benign neoplasm of skin, unspecified: Secondary | ICD-10-CM

## 2020-08-07 DIAGNOSIS — L814 Other melanin hyperpigmentation: Secondary | ICD-10-CM

## 2020-08-07 HISTORY — DX: Other benign neoplasm of skin, unspecified: D23.9

## 2020-08-07 NOTE — Patient Instructions (Addendum)
Melanoma ABCDEs  Melanoma is the most dangerous type of skin cancer, and is the leading cause of death from skin disease.  You are more likely to develop melanoma if you:  Have light-colored skin, light-colored eyes, or red or blond hair  Spend a lot of time in the sun  Tan regularly, either outdoors or in a tanning bed  Have had blistering sunburns, especially during childhood  Have a close family member who has had a melanoma  Have atypical moles or large birthmarks  Early detection of melanoma is key since treatment is typically straightforward and cure rates are extremely high if we catch it early.   The first sign of melanoma is often a change in a mole or a new dark spot.  The ABCDE system is a way of remembering the signs of melanoma.  A for asymmetry:  The two halves do not match. B for border:  The edges of the growth are irregular. C for color:  A mixture of colors are present instead of an even brown color. D for diameter:  Melanomas are usually (but not always) greater than 6mm - the size of a pencil eraser. E for evolution:  The spot keeps changing in size, shape, and color.  Please check your skin once per month between visits. You can use a small mirror in front and a large mirror behind you to keep an eye on the back side or your body.   If you see any new or changing lesions before your next follow-up, please call to schedule a visit.  Please continue daily skin protection including broad spectrum sunscreen SPF 30+ to sun-exposed areas, reapplying every 2 hours as needed when you're outdoors.   Staying in the shade or wearing long sleeves, sun glasses (UVA+UVB protection) and wide brim hats (4-inch brim around the entire circumference of the hat) are also recommended for sun protection.   Recommend taking Heliocare sun protection supplement daily in sunny weather for additional sun protection. For maximum protection on the sunniest days, you can take up to 2  capsules of regular Heliocare OR take 1 capsule of Heliocare Ultra. For prolonged exposure (such as a full day in the sun), you can repeat your dose of the supplement 4 hours after your first dose. Heliocare can be purchased at [facility] or at [email or web address].[email or web address].   Biopsy Wound Care Instructions  1. Leave the original bandage on for 24 hours if possible.  If the bandage becomes soaked or soiled before that time, it is OK to remove it and examine the wound.  A small amount of post-operative bleeding is normal.  If excessive bleeding occurs, remove the bandage, place gauze over the site and apply continuous pressure (no peeking) over the area for 30 minutes. If this does not work, please call our clinic as soon as possible or page your doctor if it is after hours.   2. Once a day, cleanse the wound with soap and water. It is fine to shower. If a thick crust develops you may use a Q-tip dipped into dilute hydrogen peroxide (mix 1:1 with water) to dissolve it.  Hydrogen peroxide can slow the healing process, so use it only as needed.    3. After washing, apply petroleum jelly (Vaseline) or an antibiotic ointment if your doctor prescribed one for you, followed by a bandage.    4. For best healing, the wound should be covered with a layer of ointment at all times. If you are   not able to keep the area covered with a bandage to hold the ointment in place, this may mean re-applying the ointment several times a day.  Continue this wound care until the wound has healed and is no longer open.   Itching and mild discomfort is normal during the healing process. However, if you develop pain or severe itching, please call our office.   If you have any discomfort, you can take Tylenol (acetaminophen) or ibuprofen as directed on the bottle. (Please do not take these if you have an allergy to them or cannot take them for another reason).  Some redness, tenderness and white or yellow material in the wound is  normal healing.  If the area becomes very sore and red, or develops a thick yellow-green material (pus), it may be infected; please notify us.    If you have stitches, return to clinic as directed to have the stitches removed. You will continue wound care for 2-3 days after the stitches are removed.   Wound healing continues for up to one year following surgery. It is not unusual to experience pain in the scar from time to time during the interval.  If the pain becomes severe or the scar thickens, you should notify the office.    A slight amount of redness in a scar is expected for the first six months.  After six months, the redness will fade and the scar will soften and fade.  The color difference becomes less noticeable with time.  If there are any problems, return for a post-op surgery check at your earliest convenience.  To improve the appearance of the scar, you can use silicone scar gel, cream, or sheets (such as Mederma or Serica) every night for up to one year. These are available over the counter (without a prescription).  Please call our office at [phone] for any questions or concerns.    

## 2020-08-07 NOTE — Progress Notes (Signed)
   Follow-Up Visit   Subjective  Jillian Stafford is a 50 y.o. female who presents for the following: follow up (Patient here today with several spots on face she would like removed today. Patient also states she has a spot on left arm and left leg she would like checked. ).  The following portions of the chart were reviewed this encounter and updated as appropriate:  Tobacco  Allergies  Meds  Problems  Med Hx  Surg Hx  Fam Hx      Recheck left upper arm will reche 0.3 cm light to medi brown papule consisted with benign nevus     Objective  Well appearing patient in no apparent distress; mood and affect are within normal limits.  A focused examination was performed including face, neck, chest, left arm, right arm, upper back. Relevant physical exam findings are noted in the Assessment and Plan.  Objective  Right nasolabial facial angle: 0.3 cm very small excoriated papule   Objective  Left Upper Arm - Anterior: 0.3 medium to light brown papule, favor benign nevus, recheck at follow-up  Objective  upper mid back: 0.6 cm irregular thin medium brown papule           Assessment & Plan  Neoplasm of uncertain behavior (3) Right nasofacial angle Favor excoriated inflammatory papule. Start vaseline and avoid touching. Recheck at follow-up. Call for changes  Left Upper Arm - Anterior Left upper arm - favor benign nevus. Recheck at follow-up. Call for changes  upper mid back  Epidermal / dermal shaving  Lesion diameter (cm):  0.6 Informed consent: discussed and consent obtained   Timeout: patient name, date of birth, surgical site, and procedure verified   Anesthesia: the lesion was anesthetized in a standard fashion   Anesthetic:  1% lidocaine w/ epinephrine 1-100,000 local infiltration Instrument used: flexible razor blade   Hemostasis achieved with: aluminum chloride   Outcome: patient tolerated procedure well   Post-procedure details: wound care instructions  given   Additional details:  Mupirocin and a bandage applied  Specimen 1 - Surgical pathology Differential Diagnosis: r/o atypia   Check Margins: No 0.6 cm irregular thin medium brown papule    Upper mid back - R/o atypia    Seborrheic Keratoses - Stuck-on, waxy, tan-brown papules and/or plaques on left forearm, left lower leg  - Benign-appearing - Discussed benign etiology and prognosis. - Observe - Call for any changes  Lentigines - Scattered tan macules - Due to sun exposure - Benign-appering, observe - Recommend daily broad spectrum sunscreen SPF 30+ to sun-exposed areas, reapply every 2 hours as needed. - Call for any changes  Actinic Damage - chronic, secondary to cumulative UV radiation exposure/sun exposure over time - diffuse scaly erythematous macules with underlying dyspigmentation - Recommend daily broad spectrum sunscreen SPF 30+ to sun-exposed areas, reapply every 2 hours as needed.  - Recommend staying in the shade or wearing long sleeves, sun glasses (UVA+UVB protection) and wide brim hats (4-inch brim around the entire circumference of the hat). - Call for new or changing lesions.  History of skin cancer - will request records from [facility]  Return in about 2 months (around 10/07/2020) for tbse. and recheck spots   I, Ruthell Rummage, CMA, am acting as scribe for Forest Gleason, MD.  Documentation: I have reviewed the above documentation for accuracy and completeness, and I agree with the above.  Forest Gleason, MD

## 2020-08-11 ENCOUNTER — Other Ambulatory Visit: Payer: Self-pay | Admitting: Internal Medicine

## 2020-08-11 DIAGNOSIS — Z1231 Encounter for screening mammogram for malignant neoplasm of breast: Secondary | ICD-10-CM

## 2020-08-12 ENCOUNTER — Telehealth: Payer: Self-pay

## 2020-08-12 NOTE — Telephone Encounter (Signed)
Spoke with pt and informed her of results. She had no concerns. Pt scheduled for excision.

## 2020-08-12 NOTE — Telephone Encounter (Signed)
-----   Message from Alfonso Patten, MD sent at 08/12/2020  1:42 PM EDT ----- Skin , upper mid back Hartrandt, WITH SCAR AND PERSISTENT NEVUS-LIKE CHANGES, CLOSE TO MARGIN --> Excision   This is a SEVERELY ATYPICAL MOLE. On the spectrum from normal mole to melanoma skin cancer, this is in between the two but closer towards a melanoma skin cancer.  - The treatment of choice for severely atypical moles is to cut them out in clinic with an area of normal looking skin around them to get all the atypical cells out. The skin that is removed will be sent to check under the microscope again to be sure it looks completely out.   - People who have a history of atypical moles do have a slightly increased risk of developing melanoma somewhere on the body, so a full body skin exam by a dermatologist is recommended at least once a year. - Monthly self skin checks and daily sun protection are also recommended.  - Please also call if you notice any new or changing spots anywhere else on the body before your follow-up visit.   Dr. Jerilynn Mages left voicemail 08/11/2020. Mas please call starting 08/12/2020. Thank you!

## 2020-10-07 ENCOUNTER — Other Ambulatory Visit: Payer: Self-pay

## 2020-10-07 ENCOUNTER — Ambulatory Visit
Admission: RE | Admit: 2020-10-07 | Discharge: 2020-10-07 | Disposition: A | Discharge status: Home / Self Care | Payer: Managed Care, Other (non HMO) | Source: Ambulatory Visit | Attending: Internal Medicine | Admitting: Internal Medicine

## 2020-10-07 DIAGNOSIS — Z1231 Encounter for screening mammogram for malignant neoplasm of breast: Secondary | ICD-10-CM | POA: Insufficient documentation

## 2020-10-08 ENCOUNTER — Encounter: Payer: Managed Care, Other (non HMO) | Admitting: Dermatology

## 2020-11-27 ENCOUNTER — Ambulatory Visit: Payer: Managed Care, Other (non HMO) | Admitting: Dermatology

## 2021-02-06 ENCOUNTER — Other Ambulatory Visit: Payer: Self-pay

## 2021-02-06 MED ORDER — HYDROXYCHLOROQUINE SULFATE 200 MG PO TABS
200.0000 mg | ORAL_TABLET | Freq: Two times a day (BID) | ORAL | 5 refills | Status: AC
  Filled 2021-02-06: qty 60, 30d supply, fill #0
  Filled 2021-03-13: qty 60, 30d supply, fill #1

## 2021-02-10 ENCOUNTER — Other Ambulatory Visit: Payer: Self-pay

## 2021-02-10 MED ORDER — FLUCONAZOLE 150 MG PO TABS: ORAL_TABLET | ORAL | 0 refills | Status: AC

## 2021-02-10 MED ORDER — LEVOTHYROXINE SODIUM 50 MCG PO TABS
50.0000 ug | ORAL_TABLET | Freq: Every day | ORAL | 0 refills | Status: DC
  Filled 2021-02-10: qty 30, 30d supply, fill #0

## 2021-02-10 MED ORDER — "TECHLITE INSULIN SYRINGE 31G X 15/64"" 1 ML MISC": 12 refills | Status: AC

## 2021-02-10 MED ORDER — FUROSEMIDE 20 MG PO TABS: ORAL_TABLET | ORAL | 3 refills | Status: AC

## 2021-02-10 MED ORDER — HYDROXYCHLOROQUINE SULFATE 200 MG PO TABS: ORAL_TABLET | ORAL | 5 refills | Status: AC

## 2021-02-10 MED ORDER — VENLAFAXINE HCL ER 37.5 MG PO CP24: ORAL_CAPSULE | ORAL | 1 refills | Status: AC

## 2021-02-10 MED ORDER — PANTOPRAZOLE SODIUM 40 MG PO TBEC
DELAYED_RELEASE_TABLET | ORAL | 3 refills | Status: AC
  Filled 2021-03-03: qty 90, 90d supply, fill #0
  Filled 2021-05-28: qty 90, 90d supply, fill #1
  Filled 2021-09-03: qty 90, 90d supply, fill #2

## 2021-02-10 MED ORDER — FOLIC ACID 1 MG PO TABS: ORAL_TABLET | ORAL | 9 refills | Status: AC

## 2021-02-10 MED ORDER — FLUTICASONE-SALMETEROL 100-50 MCG/ACT IN AEPB
INHALATION_SPRAY | RESPIRATORY_TRACT | 11 refills | Status: DC
  Filled 2021-03-13: qty 60, 30d supply, fill #0
  Filled 2021-05-28: qty 60, 30d supply, fill #1
  Filled 2021-07-29: qty 60, 30d supply, fill #2

## 2021-02-10 MED ORDER — OZEMPIC (0.25 OR 0.5 MG/DOSE) 2 MG/1.5ML ~~LOC~~ SOPN: PEN_INJECTOR | SUBCUTANEOUS | 0 refills | Status: AC

## 2021-02-10 MED ORDER — ALBUTEROL SULFATE HFA 108 (90 BASE) MCG/ACT IN AERS: INHALATION_SPRAY | RESPIRATORY_TRACT | 1 refills | Status: AC

## 2021-02-11 ENCOUNTER — Other Ambulatory Visit: Payer: Self-pay

## 2021-02-12 ENCOUNTER — Other Ambulatory Visit: Payer: Self-pay

## 2021-02-12 MED ORDER — CYCLOBENZAPRINE HCL 10 MG PO TABS
ORAL_TABLET | ORAL | 0 refills | Status: DC
  Filled 2021-02-12: qty 30, 15d supply, fill #0

## 2021-03-03 ENCOUNTER — Other Ambulatory Visit: Payer: Self-pay

## 2021-03-13 ENCOUNTER — Other Ambulatory Visit: Payer: Self-pay

## 2021-03-13 MED ORDER — LEVOTHYROXINE SODIUM 50 MCG PO TABS
ORAL_TABLET | ORAL | 1 refills | Status: DC
  Filled 2021-03-13: qty 90, 90d supply, fill #0
  Filled 2021-06-17: qty 90, 90d supply, fill #1

## 2021-03-24 ENCOUNTER — Other Ambulatory Visit: Payer: Self-pay

## 2021-03-24 MED ORDER — BUPROPION HCL ER (XL) 150 MG PO TB24
ORAL_TABLET | ORAL | 11 refills | Status: AC
  Filled 2021-03-24: qty 30, 30d supply, fill #0

## 2021-03-27 ENCOUNTER — Other Ambulatory Visit: Payer: Self-pay

## 2021-03-31 ENCOUNTER — Other Ambulatory Visit: Payer: Self-pay | Admitting: Internal Medicine

## 2021-03-31 DIAGNOSIS — R0789 Other chest pain: Secondary | ICD-10-CM

## 2021-04-01 ENCOUNTER — Telehealth: Payer: Self-pay

## 2021-04-01 NOTE — Telephone Encounter (Signed)
Left message for patient to return call to office to reschedule surgery appointment.  Patient had bx 08/07/20 at upper mid back that showed severe atypia. Patient was scheduled for surgery 6/29 and follow up 8/18, both were cancelled. Patient does have follow up appt 04/23/21.

## 2021-04-20 ENCOUNTER — Other Ambulatory Visit: Payer: Managed Care, Other (non HMO)

## 2021-04-21 ENCOUNTER — Other Ambulatory Visit: Payer: Self-pay | Admitting: Student

## 2021-04-21 DIAGNOSIS — R079 Chest pain, unspecified: Secondary | ICD-10-CM

## 2021-04-23 ENCOUNTER — Ambulatory Visit: Payer: Managed Care, Other (non HMO) | Admitting: Dermatology

## 2021-04-23 ENCOUNTER — Other Ambulatory Visit: Payer: Managed Care, Other (non HMO)

## 2021-04-23 ENCOUNTER — Inpatient Hospital Stay: Admission: RE | Admit: 2021-04-23 | Payer: Managed Care, Other (non HMO) | Source: Ambulatory Visit

## 2021-04-23 NOTE — Telephone Encounter (Signed)
Please call patient to offer her a surgery appointment February 1st to treat her severely atypical mole. We could then do her follow-up and suture removal on February 14th at her scheduled appointment. If she has any questions or concerns for me, please let me know - I am happy to call her. Thank you!

## 2021-04-23 NOTE — Telephone Encounter (Signed)
Left VM for patient to return my call./js

## 2021-04-28 ENCOUNTER — Other Ambulatory Visit: Payer: Self-pay

## 2021-04-28 MED ORDER — WEGOVY 0.5 MG/0.5ML ~~LOC~~ SOAJ
SUBCUTANEOUS | 2 refills | Status: AC
  Filled 2021-04-28 – 2021-05-07 (×3): qty 2, 28d supply, fill #0
  Filled 2021-05-22 – 2021-06-08 (×2): qty 2, 28d supply, fill #1

## 2021-05-01 ENCOUNTER — Other Ambulatory Visit: Payer: Self-pay

## 2021-05-07 ENCOUNTER — Other Ambulatory Visit: Payer: Self-pay

## 2021-05-11 ENCOUNTER — Other Ambulatory Visit: Payer: Self-pay

## 2021-05-11 ENCOUNTER — Ambulatory Visit
Admission: RE | Admit: 2021-05-11 | Discharge: 2021-05-11 | Disposition: A | Discharge status: Home / Self Care | Payer: Managed Care, Other (non HMO) | Source: Ambulatory Visit | Attending: Student | Admitting: Student

## 2021-05-11 DIAGNOSIS — R079 Chest pain, unspecified: Secondary | ICD-10-CM | POA: Insufficient documentation

## 2021-05-12 NOTE — Telephone Encounter (Signed)
Patient returned call today and left msg on nurse line. When would you like to see her now?

## 2021-05-13 NOTE — Telephone Encounter (Signed)
Spoke with patient regarding information per [facility] and Dr. Laurence Ferrari. Patient was under the impression it was up to her if she really wanted to have the surgery done or not. I went over severe dysplastics with patient again and how its important to collect deeper and wider tissue to confirm clear margins. Patient also advised that severe dysplastics left untreated have a risk of changing into a melanoma, that this can not be ruled out.   At this time patient wants to keep her 2/14 follow up appointment and discuss with Dr. Laurence Ferrari again in person.

## 2021-05-13 NOTE — Telephone Encounter (Signed)
Left pt message to return my call. aw

## 2021-05-13 NOTE — Telephone Encounter (Signed)
I would just offer her next available surgery appointment. I'm hoping I still have something soon since it looks like a lot of my surgery slots were partially filled. Thank you!

## 2021-05-19 ENCOUNTER — Other Ambulatory Visit: Payer: Self-pay

## 2021-05-21 NOTE — Telephone Encounter (Signed)
Thank you, Amanda! 

## 2021-05-22 ENCOUNTER — Other Ambulatory Visit: Payer: Self-pay

## 2021-05-26 ENCOUNTER — Other Ambulatory Visit: Payer: Self-pay

## 2021-05-26 ENCOUNTER — Ambulatory Visit: Payer: Managed Care, Other (non HMO) | Admitting: Dermatology

## 2021-05-28 ENCOUNTER — Other Ambulatory Visit: Payer: Self-pay

## 2021-05-29 ENCOUNTER — Other Ambulatory Visit: Payer: Self-pay

## 2021-06-02 ENCOUNTER — Other Ambulatory Visit: Payer: Self-pay

## 2021-06-03 ENCOUNTER — Other Ambulatory Visit: Payer: Self-pay

## 2021-06-03 MED ORDER — CYCLOBENZAPRINE HCL 10 MG PO TABS
ORAL_TABLET | ORAL | 0 refills | Status: DC
  Filled 2021-06-03: qty 30, 15d supply, fill #0

## 2021-06-08 ENCOUNTER — Other Ambulatory Visit: Payer: Self-pay

## 2021-06-09 ENCOUNTER — Ambulatory Visit (INDEPENDENT_AMBULATORY_CARE_PROVIDER_SITE_OTHER): Payer: Self-pay | Admitting: Plastic Surgery

## 2021-06-09 ENCOUNTER — Encounter: Payer: Self-pay | Admitting: Plastic Surgery

## 2021-06-09 ENCOUNTER — Other Ambulatory Visit: Payer: Self-pay

## 2021-06-09 DIAGNOSIS — Z719 Counseling, unspecified: Secondary | ICD-10-CM

## 2021-06-09 NOTE — Progress Notes (Signed)
Patient ID: Jillian Stafford, female    DOB: [birth year], 51 y.o.   MRN: [id number]   Chief Complaint  Patient presents with   consult   Breast Problem    The patient is a 51 year old female here for evaluation of her breasts.  She is 5 feet 8 inches tall and weighs 196 pounds.  Her sternal notch to nipple distance is 27 cm on both sides.  She had saline implants placed in 2003 by Dr. Wendy Stafford.  It appears they were placed through a periareolar incision from 9-3 o'clock.  The implants are thought to be saline and under the muscle.  The patient is not sure about the size of the implants.  She complains of pain and discomfort in the upper half of her body.  She is worried that it may be because of the implants.  She is a former smoker but has not smoked in years.  She is not on any blood thinner.  She does have chronic pain in her upper back and arm area.  Her last mammogram was in June 2022 and was negative.  I have seen that and signed the copy.   Review of Systems  Constitutional: Negative.  Negative for activity change and appetite change.  HENT: Negative.    Eyes: Negative.   Respiratory: Negative.  Negative for chest tightness and shortness of breath.   Cardiovascular: Negative.   Gastrointestinal: Negative.   Endocrine: Negative.   Genitourinary: Negative.   Musculoskeletal: Negative.   Skin: Negative.   Hematological: Negative.   Psychiatric/Behavioral: Negative.     Past Medical History:  Diagnosis Date   Anemia    Asthma    Celiac disease    Chronic kidney disease    Dysplastic nevus 08/07/2020   severe, upper mid back - excision scheduled 10/08/20    Past Surgical History:  Procedure Laterality Date   Abdomin0plastyl      ABDOMINAL HYSTERECTOMY     AUGMENTATION MAMMAPLASTY     2001   COLONOSCOPY WITH PROPOFOL N/A 01/10/2015   Procedure: COLONOSCOPY WITH PROPOFOL;  Surgeon: Lollie Sails, MD;  Location: [facility] ENDOSCOPY;  Service: Endoscopy;  Laterality: N/A;   ear  surgery left Left    ESOPHAGOGASTRODUODENOSCOPY (EGD) WITH PROPOFOL N/A 01/10/2015   Procedure: ESOPHAGOGASTRODUODENOSCOPY (EGD) WITH PROPOFOL;  Surgeon: Lollie Sails, MD;  Location: [facility] ENDOSCOPY;  Service: Endoscopy;  Laterality: N/A;      Current Outpatient Medications:    albuterol (VENTOLIN HFA) 108 (90 Base) MCG/ACT inhaler, Inhale 1 puff into the lungs every 6 hours as needed for wheezing or shortness of breath, Disp: 8.5 g, Rfl: 1   Cyanocobalamin (VITAMIN B 12 PO), Take 1,000 mg by mouth., Disp: , Rfl:    diphenhydrAMINE (BENADRYL) 25 mg capsule, Take 25 mg by mouth every 6 (six) hours as needed., Disp: , Rfl:    fluticasone-salmeterol (ADVAIR DISKUS) 100-50 MCG/ACT AEPB, Inhale one puff into the lungs every 12 hours, Disp: 60 each, Rfl: 11   furosemide (LASIX) 20 MG tablet, Take 1/2 tablet by mouth daily as needed for edema, Disp: 15 tablet, Rfl: 3   Insulin Syringe-Needle U-100 (TECHLITE INSULIN SYRINGE) 31G X 15/64" 1 ML MISC, use as directed, Disp: 100 each, Rfl: 12   levothyroxine (SYNTHROID) 50 MCG tablet, Take 1 tablet (50 mcg total) by mouth daily on an empty stomach with a glass of water at least 30-60 min before breakfast., Disp: 90 tablet, Rfl: 1   loperamide (IMODIUM) 2  MG capsule, Take by mouth as needed for diarrhea or loose stools., Disp: , Rfl:    pantoprazole (PROTONIX) 20 MG tablet, Take 20 mg by mouth daily., Disp: , Rfl:    pantoprazole (PROTONIX) 40 MG tablet, Take 1 tablet by mouth daily 30 minutes prior to a meal, Disp: 90 tablet, Rfl: 3   Semaglutide-Weight Management (WEGOVY) 0.5 MG/0.5ML SOAJ, Inject 0.5 mLs (0.5 mg total) subcutaneously once a week, Disp: 2 mL, Rfl: 2   buPROPion (WELLBUTRIN XL) 150 MG 24 hr tablet, Take 1 tablet (150 mg total) by mouth once daily, Disp: 30 tablet, Rfl: 11   cyclobenzaprine (FLEXERIL) 10 MG tablet, Take one tablet by mouth twice daily as needed for muscle spasms for up to 15 days, Disp: 30 tablet, Rfl: 0   fluconazole  (DIFLUCAN) 150 MG tablet, Take one tablet by mouth once for one dose. May repeat if symptoms recur., Disp: 1 tablet, Rfl: 0   folic acid (FOLVITE) 1 MG tablet, Take one tablet by mouth daily, Disp: 30 tablet, Rfl: 9   hydroxychloroquine (PLAQUENIL) 200 MG tablet, Take 1 tablet (200 mg total) by mouth 2 (two) times daily, Disp: 60 tablet, Rfl: 5   hydroxychloroquine (PLAQUENIL) 200 MG tablet, Take one tablet by mouth twice daily, Disp: 60 tablet, Rfl: 5   Semaglutide,0.25 or 0.5MG /DOS, (OZEMPIC, 0.25 OR 0.5 MG/DOSE,) 2 MG/1.5ML SOPN, Inject 0.25mg  into the skin once a week for 28 days, Disp: 1.5 mL, Rfl: 0   venlafaxine XR (EFFEXOR-XR) 37.5 MG 24 hr capsule, Take one capsule by mouth daily, Disp: 30 capsule, Rfl: 1   Objective:   Vitals:   06/09/21 0849  BP: 106/72    Physical Exam Vitals reviewed.  Constitutional:      Appearance: Normal appearance.  HENT:     Head: Normocephalic and atraumatic.  Cardiovascular:     Rate and Rhythm: Normal rate.     Pulses: Normal pulses.  Pulmonary:     Effort: Pulmonary effort is normal. No respiratory distress.  Abdominal:     General: There is no distension.     Palpations: Abdomen is soft.     Tenderness: There is no abdominal tenderness.  Musculoskeletal:        General: No swelling or deformity.  Skin:    General: Skin is warm.     Capillary Refill: Capillary refill takes less than 2 seconds.     Coloration: Skin is not jaundiced.     Findings: No bruising or lesion.  Neurological:     Mental Status: She is oriented to person, place, and time.  Psychiatric:        Mood and Affect: Mood normal.        Behavior: Behavior normal.        Thought Content: Thought content normal.        Judgment: Judgment normal.    Assessment & Plan:  Encounter for counseling  The patient would like a quote for removal of the implants and replacement with saline implants.  She would likely not put them back in but would like the quote to include it  so she knows.  The patient will try and find her card with her implant sizes and send that to [place].  Pictures were obtained of the patient and placed in the chart with the patient's or guardian's permission.   [place], DO

## 2021-06-15 ENCOUNTER — Telehealth: Payer: Self-pay

## 2021-06-15 NOTE — Telephone Encounter (Signed)
LVM for patient to call concerning appt tomorrow. Can offer her SU appt 3/27 at 2:30 with Dr. Nicole Kindred. ?Lurlean Horns., RMA ?

## 2021-06-16 ENCOUNTER — Encounter: Payer: Managed Care, Other (non HMO) | Admitting: Dermatology

## 2021-06-17 ENCOUNTER — Ambulatory Visit: Payer: Managed Care, Other (non HMO) | Admitting: Dermatology

## 2021-06-17 ENCOUNTER — Other Ambulatory Visit: Payer: Self-pay

## 2021-07-07 ENCOUNTER — Other Ambulatory Visit: Payer: Self-pay

## 2021-07-07 MED ORDER — WEGOVY 1 MG/0.5ML ~~LOC~~ SOAJ
SUBCUTANEOUS | 1 refills | Status: DC
  Filled 2021-07-07 – 2021-07-14 (×2): qty 2, 28d supply, fill #0
  Filled 2021-08-13: qty 2, 28d supply, fill #1

## 2021-07-13 ENCOUNTER — Encounter: Payer: Self-pay | Admitting: Dermatology

## 2021-07-13 ENCOUNTER — Ambulatory Visit: Payer: Managed Care, Other (non HMO) | Admitting: Dermatology

## 2021-07-13 DIAGNOSIS — D239 Other benign neoplasm of skin, unspecified: Secondary | ICD-10-CM

## 2021-07-13 DIAGNOSIS — D235 Other benign neoplasm of skin of trunk: Secondary | ICD-10-CM

## 2021-07-13 NOTE — Progress Notes (Signed)
? ?  Follow-Up Visit ?  ?Subjective  ?Jillian Stafford is a 51 y.o. female who presents for the following: Severe dysplastic nevus bx proven (Upper mid back, pt presents for excision). ? ? ? ?The following portions of the chart were reviewed this encounter and updated as appropriate:  ?  ?  ? ?Review of Systems:  No other skin or systemic complaints except as noted in HPI or Assessment and Plan. ? ?Objective  ?Well appearing patient in no apparent distress; mood and affect are within normal limits. ? ?A focused examination was performed including back. Relevant physical exam findings are noted in the Assessment and Plan. ? ?upper mid back ?Pink bx site 0.9cm ? ? ? ?Assessment & Plan  ?Dysplastic nevus ?upper mid back ? ?Severe, bx proven ? ?Excised today ? ? ?Skin excision - upper mid back ? ?Lesion length (cm):  1 ?Lesion width (cm):  1 ?Margin per side (cm):  0.2 ?Total excision diameter (cm):  1.4 ?Informed consent: discussed and consent obtained   ?Timeout: patient name, date of birth, surgical site, and procedure verified   ?Procedure prep:  Patient was prepped and draped in usual sterile fashion ?Prep type:  Povidone-iodine ?Anesthesia: the lesion was anesthetized in a standard fashion   ?Anesthetic:  1% lidocaine w/ epinephrine 1-100,000 buffered w/ 8.4% NaHCO3 (9cc lido w/ epi, 6cc bupivicaine, Total of 15cc) ?Instrument used: #15 blade   ?Hemostasis achieved with: pressure and electrodesiccation   ?Outcome: patient tolerated procedure well with no complications   ? ?Skin repair - upper mid back ?Complexity:  Intermediate ?Final length (cm):  4.2 ?Informed consent: discussed and consent obtained   ?Timeout: patient name, date of birth, surgical site, and procedure verified   ?Reason for type of repair: reduce tension to allow closure, reduce the risk of dehiscence, infection, and necrosis, reduce subcutaneous dead space and avoid a hematoma, preserve normal anatomical and functional relationships and enhance both  functionality and cosmetic results   ?Undermining: edges undermined   ?Subcutaneous layers (deep stitches):  ?Suture size:  3-0 ?Suture type: Vicryl (polyglactin 910)   ?Subcutaneous suture technique: inverted dermal. ?Fine/surface layer approximation (top stitches):  ?Suture size:  3-0 ?Suture type: nylon   ?Stitches: simple interrupted   ?Suture removal (days):  7 ?Hemostasis achieved with: suture ?Outcome: patient tolerated procedure well with no complications   ?Post-procedure details: sterile dressing applied and wound care instructions given   ?Dressing type: pressure dressing (Mupirocin ointment)   ?Additional details:  Tagged at superior 12 o'clock ? ?Specimen 1 - Surgical pathology ?Differential Diagnosis: D48.5 Bx proven Severe Dysplastic nevus ? ?Check Margins: yes ?Pink bx site 1.0cm ?[id number] ?Tagged at superior 12 o'clock ? ? ?Return in about 1 week (around 07/20/2021) for suture removal, 29mTBSE , Hx of Dysplastic nevi. ? ?I, SOthelia Pulling RMA, am acting as scribe for TBrendolyn Patty MD . ? ?Documentation: I have reviewed the above documentation for accuracy and completeness, and I agree with the above. ? ?TBrendolyn PattyMD  ?

## 2021-07-13 NOTE — Patient Instructions (Addendum)
?Wound Care Instructions ? ?On the day following your surgery, you should begin doing daily dressing changes: ?Remove the old dressing and discard it. ?Cleanse the wound gently with tap water. This may be done in the shower or by placing a wet gauze pad directly on the wound and letting it soak for several minutes. ?It is important to gently remove any dried blood from the wound in order to encourage healing. This may be done by gently rolling a moistened Q-tip on the dried blood. Do not pick at the wound. ?If the wound should start to bleed, continue cleaning the wound, then place a moist gauze pad on the wound and hold pressure for a few minutes.  ?Make sure you then dry the skin surrounding the wound completely or the tape will not stick to the skin. Do not use cotton balls on the wound. ?After the wound is clean and dry, apply the ointment gently with a Q-tip. ?Cut a non-stick pad to fit the size of the wound. Lay the pad flush to the wound. If the wound is draining, you may want to reinforce it with a small amount of gauze on top of the non-stick pad for a little added compression to the area. ?Use the tape to seal the area completely. ?Select from the following with respect to your individual situation: ?If your wound has been stitched closed: continue the above steps 1-8 at least daily until your sutures are removed. ?If your wound has been left open to heal: continue steps 1-8 at least daily for the first 3-4 weeks. ?We would like for you to take a few extra precautions for at least the next week. ?Sleep with your head elevated on pillows if our wound is on your head. ?Do not bend over or lift heavy items to reduce the chance of elevated blood pressure to the wound ?Do not participate in particularly strenuous activities. ? ? ?Below is a list of dressing supplies you might need.  ?Cotton-tipped applicators - Q-tips ?Gauze pads (2x2 and/or 4x4) - All-Purpose Sponges ?Non-stick dressing material - Telfa ?Tape  - Paper or Hypafix ?New and clean tube of petroleum jelly - Vaseline  ? ? ?Comments on Post-Operative Period ?Slight swelling and redness often appear around the wound. This is normal and will disappear within several days following the surgery. ?The healing wound will drain a brownish-red-yellow discharge during healing. This is a normal phase of wound healing. As the wound begins to heal, the drainage may increase in amount. Again, this drainage is normal. ?Notify us if the drainage becomes persistently bloody, excessively swollen, or intensely painful or develops a foul odor or red streaks.  ?If you should experience mild discomfort during the healing phase, you may take an aspirin-free medication such as Tylenol (acetaminophen). Notify us if the discomfort is severe or persistent. Avoid alcoholic beverages when taking pain medicine. ? ?In Case of Wound Hemorrhage ?A wound hemorrhage is when the bandage suddenly becomes soaked with bright red blood and flows profusely. If this happens, sit down or lie down with your head elevated. If the wound has a dressing on it, do not remove the dressing. Apply pressure to the existing gauze. If the wound is not covered, use a gauze pad to apply pressure and continue applying the pressure for 20 minutes without peeking. DO NOT COVER THE WOUND WITH A LARGE TOWEL OR Leon CLOTH. Release your hand from the wound site but do not remove the dressing. If the bleeding has stopped,  gently clean around the wound. Leave the dressing in place for 24 hours if possible. This wait time allows the blood vessels to close off so that you do not spark a new round of bleeding by disrupting the newly clotted blood vessels with an immediate dressing change. If the bleeding does not subside, continue to hold pressure. If matters are out of your control, contact an After Hours clinic or go to the Emergency Room. ? ? ? ? ? ? ? ?If You Need Anything After Your Visit ? ?If you have any questions or  concerns for your doctor, please call our main line at [phone] and press option 4 to reach your doctor's medical assistant. If no one answers, please leave a voicemail as directed and we will return your call as soon as possible. Messages left after 4 pm will be answered the following business day.  ? ?You may also send [place] a message via MyChart. We typically respond to MyChart messages within 1-2 business days. ? ?For prescription refills, please ask your pharmacy to contact our office. Our fax number is [phone]. ? ?If you have an urgent issue when the clinic is closed that cannot wait until the next business day, you can page your doctor at the number below.   ? ?Please note that while we do our best to be available for urgent issues outside of office hours, we are not available 24/7.  ? ?If you have an urgent issue and are unable to reach [place], you may choose to seek medical care at your doctor's office, retail clinic, urgent care center, or emergency room. ? ?If you have a medical emergency, please immediately call 911 or go to the emergency department. ? ?Pager Numbers ? ?- Dr. Nehemiah Massed: [phone] ? ?- Dr. Laurence Ferrari: [phone] ? ?- Dr. Nicole Kindred: [phone] ? ?In the event of inclement weather, please call our main line at [phone] for an update on the status of any delays or closures. ? ?Dermatology Medication Tips: ?Please keep the boxes that topical medications come in in order to help keep track of the instructions about where and how to use these. Pharmacies typically print the medication instructions only on the boxes and not directly on the medication tubes.  ? ?If your medication is too expensive, please contact our office at [phone] option 4 or send [place] a message through [place].  ? ?We are unable to tell what your co-pay for medications will be in advance as this is different depending on your insurance coverage. However, we may be able to find a substitute medication at lower cost or  fill out paperwork to get insurance to cover a needed medication.  ? ?If a prior authorization is required to get your medication covered by your insurance company, please allow [place] 1-2 business days to complete this process. ? ?Drug prices often vary depending on where the prescription is filled and some pharmacies may offer cheaper prices. ? ?The website www.goodrx.com contains coupons for medications through different pharmacies. The prices here do not account for what the cost may be with help from insurance (it may be cheaper with your insurance), but the website can give you the price if you did not use any insurance.  ?- You can print the associated coupon and take it with your prescription to the pharmacy.  ?- You may also stop by our office during regular business hours and pick up a GoodRx coupon card.  ?- If you need your prescription sent electronically to a  different pharmacy, notify our office through [facility] or by phone at [phone] option 4. ? ? ? ? ?Si Usted Necesita Algo Despu?s de Su Visita ? ?Tambi?n puede enviarnos un mensaje a trav?s de MyChart. Por lo general respondemos a los mensajes de MyChart en el transcurso de 1 a 2 d?as h?biles. ? ?Para renovar recetas, por favor pida a su farmacia que se ponga en contacto con nuestra oficina. Nuestro n?mero de fax es el [phone]. ? ?Si tiene un asunto urgente cuando la cl?nica est? cerrada y que no puede esperar hasta el siguiente d?a h?bil, puede llamar/localizar a su doctor(a) al n?mero que aparece a continuaci?n.  ? ?Por favor, tenga en cuenta que aunque hacemos todo lo posible para estar disponibles para asuntos urgentes fuera del horario de oficina, no estamos disponibles las 24 horas del d?a, los 7 d?as de la semana.  ? ?Si tiene un problema urgente y no puede comunicarse con nosotros, puede optar por buscar atenci?n m?dica  en el consultorio de su doctor(a), en una cl?nica privada, en un centro de atenci?n urgente o en una sala  de emergencias. ? ?Si tiene Engineer, maintenance (IT) m?dica, por favor llame inmediatamente al 911 o vaya a la sala de emergencias. ? ?N?meros de b?per ? ?- Dr. Nehemiah Massed: [phone] ? ?- Dra. [place]: [phone]

## 2021-07-14 ENCOUNTER — Telehealth: Payer: Self-pay

## 2021-07-14 ENCOUNTER — Other Ambulatory Visit: Payer: Self-pay

## 2021-07-14 NOTE — Telephone Encounter (Signed)
Pt doing well after yesterday's surgery./sh 

## 2021-07-20 ENCOUNTER — Ambulatory Visit (INDEPENDENT_AMBULATORY_CARE_PROVIDER_SITE_OTHER): Payer: Managed Care, Other (non HMO) | Admitting: Dermatology

## 2021-07-20 DIAGNOSIS — Z4802 Encounter for removal of sutures: Secondary | ICD-10-CM

## 2021-07-20 DIAGNOSIS — D235 Other benign neoplasm of skin of trunk: Secondary | ICD-10-CM

## 2021-07-20 DIAGNOSIS — D239 Other benign neoplasm of skin, unspecified: Secondary | ICD-10-CM

## 2021-07-20 NOTE — Patient Instructions (Signed)

## 2021-07-20 NOTE — Progress Notes (Signed)
   Follow-Up Visit   Subjective  Jillian Stafford is a 51 y.o. female who presents for the following: Follow-up (Patient here today for suture removal at upper mid back s/p excision for dysplastic nevus with severe atypia.).  The following portions of the chart were reviewed this encounter and updated as appropriate:   Tobacco  Allergies  Meds  Problems  Med Hx  Surg Hx  Fam Hx     Review of Systems:  No other skin or systemic complaints except as noted in HPI or Assessment and Plan.  Objective  Well appearing patient in no apparent distress; mood and affect are within normal limits.  A focused examination was performed including back. Relevant physical exam findings are noted in the Assessment and Plan.   Assessment & Plan   Postop dysplastic nevus removal upper mid back severe dysplastic nevus margins free Encounter for Removal of Sutures - Incision site at the upper mid back is clean, dry and intact - Wound cleansed, sutures removed, wound cleansed and steri strips applied.  - Discussed pathology results showing margins free  - Patient advised to keep steri-strips dry until they fall off. - Scars remodel for a full year. - Once steri-strips fall off, patient can apply over-the-counter silicone scar cream each night to help with scar remodeling if desired. - Patient advised to call with any concerns or if they notice any new or changing lesions.  Return for TBSE.  Graciella Belton, RMA, am acting as scribe for Sarina Ser, MD . Documentation: I have reviewed the above documentation for accuracy and completeness, and I agree with the above.  Sarina Ser, MD

## 2021-07-29 ENCOUNTER — Other Ambulatory Visit: Payer: Self-pay

## 2021-07-29 MED ORDER — FLUTICASONE-SALMETEROL 100-50 MCG/ACT IN AEPB
INHALATION_SPRAY | RESPIRATORY_TRACT | 12 refills | Status: AC
Start: 2021-07-29 — End: ?

## 2021-07-29 MED ORDER — FLUTICASONE-SALMETEROL 100-50 MCG/ACT IN AEPB
INHALATION_SPRAY | RESPIRATORY_TRACT | 1 refills | Status: AC
  Filled 2021-07-29: qty 180, 90d supply, fill #0
  Filled 2022-05-11: qty 180, 90d supply, fill #1

## 2021-07-31 ENCOUNTER — Other Ambulatory Visit: Payer: Self-pay

## 2021-07-31 MED ORDER — DULOXETINE HCL 30 MG PO CPEP
ORAL_CAPSULE | ORAL | 0 refills | Status: DC
  Filled 2021-07-31: qty 30, 30d supply, fill #0

## 2021-08-13 ENCOUNTER — Other Ambulatory Visit: Payer: Self-pay

## 2021-08-14 ENCOUNTER — Other Ambulatory Visit: Payer: Self-pay

## 2021-08-14 MED ORDER — CYCLOBENZAPRINE HCL 10 MG PO TABS
ORAL_TABLET | ORAL | 0 refills | Status: AC
  Filled 2021-08-14: qty 30, 30d supply, fill #0

## 2021-08-14 MED ORDER — WEGOVY 1 MG/0.5ML ~~LOC~~ SOAJ
SUBCUTANEOUS | 1 refills | Status: AC
  Filled 2021-08-14: qty 2, 28d supply, fill #0

## 2021-08-31 ENCOUNTER — Other Ambulatory Visit: Payer: Self-pay

## 2021-08-31 MED ORDER — DULOXETINE HCL 30 MG PO CPEP
ORAL_CAPSULE | ORAL | 0 refills | Status: AC
  Filled 2021-08-31: qty 30, 30d supply, fill #0

## 2021-09-03 ENCOUNTER — Other Ambulatory Visit: Payer: Self-pay

## 2021-09-10 ENCOUNTER — Other Ambulatory Visit: Payer: Self-pay | Admitting: Internal Medicine

## 2021-09-10 ENCOUNTER — Other Ambulatory Visit: Payer: Self-pay

## 2021-09-10 DIAGNOSIS — Z1231 Encounter for screening mammogram for malignant neoplasm of breast: Secondary | ICD-10-CM

## 2021-09-10 MED ORDER — WEGOVY 1 MG/0.5ML ~~LOC~~ SOAJ
SUBCUTANEOUS | 1 refills | Status: AC
  Filled 2021-09-11 – 2022-03-23 (×2): qty 2, 28d supply, fill #0

## 2021-09-11 ENCOUNTER — Other Ambulatory Visit: Payer: Self-pay

## 2021-09-11 MED ORDER — SULFAMETHOXAZOLE-TRIMETHOPRIM 800-160 MG PO TABS
ORAL_TABLET | ORAL | 0 refills | Status: AC
  Filled 2021-09-11: qty 6, 3d supply, fill #0

## 2021-09-11 MED ORDER — FLUCONAZOLE 150 MG PO TABS
150.0000 mg | ORAL_TABLET | Freq: Once | ORAL | 0 refills | Status: AC
  Filled 2021-09-11: qty 1, 1d supply, fill #0

## 2021-09-14 ENCOUNTER — Other Ambulatory Visit: Payer: Self-pay

## 2021-09-14 MED ORDER — WEGOVY 1.7 MG/0.75ML ~~LOC~~ SOAJ
SUBCUTANEOUS | 0 refills | Status: DC
Start: 2021-09-14 — End: 2021-09-28
  Filled 2021-09-14: qty 3, 28d supply, fill #0

## 2021-09-15 ENCOUNTER — Other Ambulatory Visit: Payer: Self-pay

## 2021-09-15 MED ORDER — METHYLPREDNISOLONE 4 MG PO TBPK
ORAL_TABLET | ORAL | 0 refills | Status: AC
  Filled 2021-09-15: qty 21, 6d supply, fill #0

## 2021-09-15 MED ORDER — LEVOTHYROXINE SODIUM 50 MCG PO TABS
ORAL_TABLET | ORAL | 1 refills | Status: AC
  Filled 2021-09-15: qty 90, 90d supply, fill #0

## 2021-09-16 ENCOUNTER — Other Ambulatory Visit: Payer: Self-pay

## 2021-09-18 ENCOUNTER — Other Ambulatory Visit: Payer: Self-pay

## 2021-09-28 ENCOUNTER — Other Ambulatory Visit: Payer: Self-pay

## 2021-09-28 MED ORDER — WEGOVY 1.7 MG/0.75ML ~~LOC~~ SOAJ
SUBCUTANEOUS | 1 refills | Status: AC
  Filled 2021-09-28 – 2021-10-14 (×2): qty 3, 28d supply, fill #0
  Filled 2022-06-09: qty 3, 28d supply, fill #1

## 2021-09-28 MED ORDER — CYANOCOBALAMIN 1000 MCG/ML IJ SOLN
INTRAMUSCULAR | 3 refills | Status: DC
  Filled 2021-09-28: qty 1, 30d supply, fill #0
  Filled 2021-10-28 (×2): qty 1, 30d supply, fill #1
  Filled 2021-11-25: qty 1, 30d supply, fill #2
  Filled 2021-12-31: qty 1, 30d supply, fill #3

## 2021-10-14 ENCOUNTER — Other Ambulatory Visit: Payer: Self-pay

## 2021-10-19 ENCOUNTER — Other Ambulatory Visit: Payer: Self-pay

## 2021-10-19 MED ORDER — WEGOVY 1.7 MG/0.75ML ~~LOC~~ SOAJ
SUBCUTANEOUS | 1 refills | Status: AC
  Filled 2021-10-19 – 2021-12-02 (×2): qty 3, 28d supply, fill #0
  Filled 2022-07-06: qty 3, 28d supply, fill #1

## 2021-10-27 ENCOUNTER — Ambulatory Visit
Admission: RE | Admit: 2021-10-27 | Discharge: 2021-10-27 | Disposition: A | Discharge status: Home / Self Care | Payer: Managed Care, Other (non HMO) | Source: Ambulatory Visit | Attending: Internal Medicine | Admitting: Internal Medicine

## 2021-10-27 DIAGNOSIS — Z1231 Encounter for screening mammogram for malignant neoplasm of breast: Secondary | ICD-10-CM | POA: Diagnosis present

## 2021-10-28 ENCOUNTER — Other Ambulatory Visit: Payer: Self-pay

## 2021-11-09 ENCOUNTER — Other Ambulatory Visit: Payer: Self-pay

## 2021-11-09 MED ORDER — WEGOVY 1.7 MG/0.75ML ~~LOC~~ SOAJ
SUBCUTANEOUS | 1 refills | Status: AC
  Filled 2021-11-09: qty 3, 28d supply, fill #0
  Filled 2022-01-29: qty 3, 28d supply, fill #1

## 2021-11-10 ENCOUNTER — Encounter: Payer: Self-pay | Admitting: Dermatology

## 2021-11-25 ENCOUNTER — Other Ambulatory Visit: Payer: Self-pay

## 2021-12-02 ENCOUNTER — Other Ambulatory Visit: Payer: Self-pay

## 2021-12-11 ENCOUNTER — Other Ambulatory Visit: Payer: Self-pay

## 2021-12-11 MED ORDER — WEGOVY 1.7 MG/0.75ML ~~LOC~~ SOAJ
SUBCUTANEOUS | 1 refills | Status: AC
  Filled 2021-12-11 – 2022-04-15 (×7): qty 3, 28d supply, fill #0
  Filled 2022-05-11: qty 3, 28d supply, fill #1

## 2021-12-11 MED ORDER — ALBUTEROL SULFATE HFA 108 (90 BASE) MCG/ACT IN AERS
INHALATION_SPRAY | RESPIRATORY_TRACT | 2 refills | Status: DC
Start: 2021-12-11 — End: 2022-09-29
  Filled 2021-12-11: qty 8.5, 16d supply, fill #0
  Filled 2022-03-23: qty 8.5, 16d supply, fill #1
  Filled 2022-05-11: qty 8.5, 16d supply, fill #2

## 2021-12-16 ENCOUNTER — Other Ambulatory Visit: Payer: Self-pay

## 2021-12-22 ENCOUNTER — Other Ambulatory Visit: Payer: Self-pay

## 2021-12-31 ENCOUNTER — Other Ambulatory Visit: Payer: Self-pay

## 2022-01-01 ENCOUNTER — Other Ambulatory Visit: Payer: Self-pay

## 2022-01-19 ENCOUNTER — Ambulatory Visit: Payer: Managed Care, Other (non HMO) | Admitting: Dermatology

## 2022-01-29 ENCOUNTER — Other Ambulatory Visit: Payer: Self-pay

## 2022-02-05 ENCOUNTER — Other Ambulatory Visit: Payer: Self-pay

## 2022-02-05 MED ORDER — "VANISHPOINT TUBERCULIN SYRINGE 25G X 1"" 1 ML MISC": 4 refills | Status: AC

## 2022-02-05 MED ORDER — CYANOCOBALAMIN 1000 MCG/ML IJ SOLN
INTRAMUSCULAR | 3 refills | Status: DC
  Filled 2022-02-05 – 2022-03-10 (×2): qty 1, 30d supply, fill #0
  Filled 2022-04-13: qty 1, 30d supply, fill #1

## 2022-02-05 MED ORDER — WEGOVY 1 MG/0.5ML ~~LOC~~ SOAJ
1.0000 mg | SUBCUTANEOUS | 1 refills | Status: DC
  Filled 2022-02-05 – 2022-03-09 (×2): qty 2, 28d supply, fill #0

## 2022-02-09 ENCOUNTER — Other Ambulatory Visit: Payer: Self-pay

## 2022-02-09 MED ORDER — SODIUM POLYSTYRENE SULFONATE PO POWD
ORAL | 0 refills | Status: AC
  Filled 2022-02-09: qty 15, 1d supply, fill #0

## 2022-02-10 ENCOUNTER — Encounter: Payer: Managed Care, Other (non HMO) | Admitting: Dermatology

## 2022-02-10 ENCOUNTER — Other Ambulatory Visit: Payer: Self-pay

## 2022-02-16 ENCOUNTER — Other Ambulatory Visit: Payer: Self-pay

## 2022-02-16 MED ORDER — FUROSEMIDE 20 MG PO TABS
ORAL_TABLET | ORAL | 0 refills | Status: AC
  Filled 2022-02-16: qty 5, 5d supply, fill #0

## 2022-02-17 ENCOUNTER — Other Ambulatory Visit: Payer: Self-pay

## 2022-02-24 ENCOUNTER — Other Ambulatory Visit: Payer: Self-pay

## 2022-03-03 ENCOUNTER — Encounter: Payer: Managed Care, Other (non HMO) | Admitting: Dermatology

## 2022-03-09 ENCOUNTER — Other Ambulatory Visit: Payer: Self-pay

## 2022-03-10 ENCOUNTER — Other Ambulatory Visit: Payer: Self-pay

## 2022-03-11 ENCOUNTER — Other Ambulatory Visit: Payer: Self-pay

## 2022-03-23 ENCOUNTER — Other Ambulatory Visit: Payer: Self-pay

## 2022-04-13 ENCOUNTER — Other Ambulatory Visit: Payer: Self-pay

## 2022-04-15 ENCOUNTER — Other Ambulatory Visit: Payer: Self-pay

## 2022-04-28 ENCOUNTER — Ambulatory Visit: Payer: Managed Care, Other (non HMO) | Admitting: Dermatology

## 2022-04-28 DIAGNOSIS — Z1283 Encounter for screening for malignant neoplasm of skin: Secondary | ICD-10-CM

## 2022-04-28 DIAGNOSIS — D485 Neoplasm of uncertain behavior of skin: Secondary | ICD-10-CM

## 2022-04-28 DIAGNOSIS — D229 Melanocytic nevi, unspecified: Secondary | ICD-10-CM

## 2022-04-28 DIAGNOSIS — D225 Melanocytic nevi of trunk: Secondary | ICD-10-CM

## 2022-04-28 DIAGNOSIS — L814 Other melanin hyperpigmentation: Secondary | ICD-10-CM

## 2022-04-28 DIAGNOSIS — L821 Other seborrheic keratosis: Secondary | ICD-10-CM

## 2022-04-28 DIAGNOSIS — Z86018 Personal history of other benign neoplasm: Secondary | ICD-10-CM

## 2022-04-28 DIAGNOSIS — L578 Other skin changes due to chronic exposure to nonionizing radiation: Secondary | ICD-10-CM

## 2022-04-28 NOTE — Progress Notes (Signed)
Follow-Up Visit   Subjective  Jillian Stafford is a 52 y.o. female who presents for the following: FBSE (Hx dysplastic nevus. Patient does have a spot at left lip that she would like to talk about removing. ).  The patient presents for Total-Body Skin Exam (TBSE) for skin cancer screening and mole check.  The patient has spots, moles and lesions to be evaluated, some may be new or changing and the patient has concerns that these could be cancer.   The following portions of the chart were reviewed this encounter and updated as appropriate:   Tobacco  Allergies  Meds  Problems  Med Hx  Surg Hx  Fam Hx      Review of Systems:  No other skin or systemic complaints except as noted in HPI or Assessment and Plan.  Objective  Well appearing patient in no apparent distress; mood and affect are within normal limits.  A full examination was performed including scalp, head, eyes, ears, nose, lips, neck, chest, axillae, abdomen, back, buttocks, bilateral upper extremities, bilateral lower extremities, hands, feet, fingers, toes, fingernails, and toenails. All findings within normal limits unless otherwise noted below.  Right Breast 6 o'clock 0.6 cm thin medium to dark brown symmetric papule     Right Lower Abdomen 0.6 cm medium to dark brown thin papule       Assessment & Plan  Nevus Right Breast 6 o'clock  Benign-appearing.  Observation.  Call clinic for new or changing lesions.  Recommend daily use of broad spectrum spf 30+ sunscreen to sun-exposed areas.    Neoplasm of uncertain behavior of skin Right Lower Abdomen  Epidermal / dermal shaving  Lesion diameter (cm):  0.6 Informed consent: discussed and consent obtained   Timeout: patient name, date of birth, surgical site, and procedure verified   Anesthesia: the lesion was anesthetized in a standard fashion   Anesthetic:  1% lidocaine w/ epinephrine 1-100,000 local infiltration Instrument used: flexible razor blade    Hemostasis achieved with: aluminum chloride   Outcome: patient tolerated procedure well   Post-procedure details: wound care instructions given   Additional details:  Mupirocin and a bandage applied  Specimen 1 - Surgical pathology Differential Diagnosis: r/o Atypia  Check Margins: No 0.6 cm medium to dark brown thin papule   History of Dysplastic Nevi - No evidence of recurrence today at upper mid back - Recommend regular full body skin exams - Recommend daily broad spectrum sunscreen SPF 30+ to sun-exposed areas, reapply every 2 hours as needed.  - Call if any new or changing lesions are noted between office visits  Lentigines - Scattered tan macules - Due to sun exposure - Benign-appearing, observe - Recommend daily broad spectrum sunscreen SPF 30+ to sun-exposed areas, reapply every 2 hours as needed. - Call for any changes  Seborrheic Keratoses - Stuck-on, waxy, tan-brown papules and/or plaques  - Benign-appearing - Discussed benign etiology and prognosis. - Observe - Call for any changes  Melanocytic Nevi - Tan-brown and/or pink-flesh-colored symmetric macules and papules - Benign appearing on exam today - Observation - Call clinic for new or changing moles - Recommend daily use of broad spectrum spf 30+ sunscreen to sun-exposed areas.   Hemangiomas - Red papules - Discussed benign nature - Observe - Call for any changes  Actinic Damage - Chronic condition, secondary to cumulative UV/sun exposure - diffuse scaly erythematous macules with underlying dyspigmentation - Recommend daily broad spectrum sunscreen SPF 30+ to sun-exposed areas, reapply every 2 hours  as needed.  - Staying in the shade or wearing long sleeves, sun glasses (UVA+UVB protection) and wide brim hats (4-inch brim around the entire circumference of the hat) are also recommended for sun protection.  - Call for new or changing lesions.  Skin cancer screening performed today.  Return in about  1 year (around 04/29/2023) for TBSE, Hx Dysplastic Nevi.  Graciella Belton, RMA, am acting as scribe for Forest Gleason, MD .  Documentation: I have reviewed the above documentation for accuracy and completeness, and I agree with the above.  Forest Gleason, MD

## 2022-04-28 NOTE — Patient Instructions (Signed)
Wound Care Instructions  Cleanse wound gently with soap and water once a day then pat dry with clean gauze. Apply a thin coat of Petrolatum (petroleum jelly, "Vaseline") over the wound (unless you have an allergy to this). We recommend that you use a new, sterile tube of Vaseline. Do not pick or remove scabs. Do not remove the yellow or white "healing tissue" from the base of the wound.  Cover the wound with fresh, clean, nonstick gauze and secure with paper tape. You may use Band-Aids in place of gauze and tape if the wound is small enough, but would recommend trimming much of the tape off as there is often too much. Sometimes Band-Aids can irritate the skin.  You should call the office for your biopsy report after 1 week if you have not already been contacted.  If you experience any problems, such as abnormal amounts of bleeding, swelling, significant bruising, significant pain, or evidence of infection, please call the office immediately.  FOR ADULT SURGERY PATIENTS: If you need something for pain relief you may take 1 extra strength Tylenol (acetaminophen) AND 2 Ibuprofen (200mg each) together every 4 hours as needed for pain. (do not take these if you are allergic to them or if you have a reason you should not take them.) Typically, you may only need pain medication for 1 to 3 days.   Recommend taking Heliocare sun protection supplement daily in sunny weather for additional sun protection. For maximum protection on the sunniest days, you can take up to 2 capsules of regular Heliocare OR take 1 capsule of Heliocare Ultra. For prolonged exposure (such as a full day in the sun), you can repeat your dose of the supplement 4 hours after your first dose. Heliocare can be purchased at [facility], at some [facility] or at www.heliocare.com.    Melanoma ABCDEs  Melanoma is the most dangerous type of skin cancer, and is the leading cause of death from skin disease.  You are more likely to develop  melanoma if you: Have light-colored skin, light-colored eyes, or red or blond hair Spend a lot of time in the sun Tan regularly, either outdoors or in a tanning bed Have had blistering sunburns, especially during childhood Have a close family member who has had a melanoma Have atypical moles or large birthmarks  Early detection of melanoma is key since treatment is typically straightforward and cure rates are extremely high if we catch it early.   The first sign of melanoma is often a change in a mole or a new dark spot.  The ABCDE system is a way of remembering the signs of melanoma.  A for asymmetry:  The two halves do not match. B for border:  The edges of the growth are irregular. C for color:  A mixture of colors are present instead of an even brown color. D for diameter:  Melanomas are usually (but not always) greater than 6mm - the size of a pencil eraser. E for evolution:  The spot keeps changing in size, shape, and color.  Please check your skin once per month between visits. You can use a small mirror in front and a large mirror behind you to keep an eye on the back side or your body.   If you see any new or changing lesions before your next follow-up, please call to schedule a visit.  Please continue daily skin protection including broad spectrum sunscreen SPF 30+ to sun-exposed areas, reapplying every 2 hours as   needed when you're outdoors.    Due to recent changes in healthcare laws, you may see results of your pathology and/or laboratory studies on MyChart before the doctors have had a chance to review them. We understand that in some cases there may be results that are confusing or concerning to you. Please understand that not all results are received at the same time and often the doctors may need to interpret multiple results in order to provide you with the best plan of care or course of treatment. Therefore, we ask that you please give us 2 business days to thoroughly  review all your results before contacting the office for clarification. Should we see a critical lab result, you will be contacted sooner.   If You Need Anything After Your Visit  If you have any questions or concerns for your doctor, please call our main line at [phone] and press option 4 to reach your doctor's medical assistant. If no one answers, please leave a voicemail as directed and we will return your call as soon as possible. Messages left after 4 pm will be answered the following business day.   You may also send us a message via MyChart. We typically respond to MyChart messages within 1-2 business days.  For prescription refills, please ask your pharmacy to contact our office. Our fax number is [phone].  If you have an urgent issue when the clinic is closed that cannot wait until the next business day, you can page your doctor at the number below.    Please note that while we do our best to be available for urgent issues outside of office hours, we are not available 24/7.   If you have an urgent issue and are unable to reach us, you may choose to seek medical care at your doctor's office, retail clinic, urgent care center, or emergency room.  If you have a medical emergency, please immediately call 911 or go to the emergency department.  Pager Numbers  - Dr. Kowalski: [phone]  - Dr. Moye: [phone]  - Dr. Stewart: [phone]  In the event of inclement weather, please call our main line at [phone] for an update on the status of any delays or closures.  Dermatology Medication Tips: Please keep the boxes that topical medications come in in order to help keep track of the instructions about where and how to use these. Pharmacies typically print the medication instructions only on the boxes and not directly on the medication tubes.   If your medication is too expensive, please contact our office at [phone] option 4 or send us a message through  MyChart.   We are unable to tell what your co-pay for medications will be in advance as this is different depending on your insurance coverage. However, we may be able to find a substitute medication at lower cost or fill out paperwork to get insurance to cover a needed medication.   If a prior authorization is required to get your medication covered by your insurance company, please allow us 1-2 business days to complete this process.  Drug prices often vary depending on where the prescription is filled and some pharmacies may offer cheaper prices.  The website www.goodrx.com contains coupons for medications through different pharmacies. The prices here do not account for what the cost may be with help from insurance (it may be cheaper with your insurance), but the website can give you the price if you did not use any insurance.  - You can   print the associated coupon and take it with your prescription to the pharmacy.  - You may also stop by our office during regular business hours and pick up a GoodRx coupon card.  - If you need your prescription sent electronically to a different pharmacy, notify our office through [place] MyChart or by phone at [phone] option 4.     Si Usted Necesita Algo Despus de Su Visita  Tambin puede enviarnos un mensaje a travs de MyChart. Por lo general respondemos a los mensajes de MyChart en el transcurso de 1 a 2 das hbiles.  Para renovar recetas, por favor pida a su farmacia que se ponga en contacto con nuestra oficina. Nuestro nmero de fax es el [phone].  Si tiene un asunto urgente cuando la clnica est cerrada y que no puede esperar hasta el siguiente da hbil, puede llamar/localizar a su doctor(a) al nmero que aparece a continuacin.   Por favor, tenga en cuenta que aunque hacemos todo lo posible para estar disponibles para asuntos urgentes fuera del horario de oficina, no estamos disponibles las 24 horas del da, los 7 das de la  semana.   Si tiene un problema urgente y no puede comunicarse con nosotros, puede optar por buscar atencin mdica  en el consultorio de su doctor(a), en una clnica privada, en un centro de atencin urgente o en una sala de emergencias.  Si tiene una emergencia mdica, por favor llame inmediatamente al 911 o vaya a la sala de emergencias.  Nmeros de bper  - Dr. Kowalski: [phone]  - Dra. Moye: [phone]  - Dra. Stewart: [phone]  En caso de inclemencias del tiempo, por favor llame a nuestra lnea principal al [phone] para una actualizacin sobre el estado de cualquier retraso o cierre.  Consejos para la medicacin en dermatologa: Por favor, guarde las cajas en las que vienen los medicamentos de uso tpico para ayudarle a seguir las instrucciones sobre dnde y cmo usarlos. Las farmacias generalmente imprimen las instrucciones del medicamento slo en las cajas y no directamente en los tubos del medicamento.   Si su medicamento es muy caro, por favor, pngase en contacto con nuestra oficina llamando al [phone] y presione la opcin 4 o envenos un mensaje a travs de MyChart.   No podemos decirle cul ser su copago por los medicamentos por adelantado ya que esto es diferente dependiendo de la cobertura de su seguro. Sin embargo, es posible que podamos encontrar un medicamento sustituto a menor costo o llenar un formulario para que el seguro cubra el medicamento que se considera necesario.   Si se requiere una autorizacin previa para que su compaa de seguros cubra su medicamento, por favor permtanos de 1 a 2 das hbiles para completar este proceso.  Los precios de los medicamentos varan con frecuencia dependiendo del lugar de dnde se surte la receta y alguna farmacias pueden ofrecer precios ms baratos.  El sitio web www.goodrx.com tiene cupones para medicamentos de diferentes farmacias. Los precios aqu no tienen en cuenta lo que podra costar con la ayuda del  seguro (puede ser ms barato con su seguro), pero el sitio web puede darle el precio si no utiliz ningn seguro.  - Puede imprimir el cupn correspondiente y llevarlo con su receta a la farmacia.  - Tambin puede pasar por nuestra oficina durante el horario de atencin regular y recoger una tarjeta de cupones de GoodRx.  - Si necesita que su receta se enve electrnicamente a una farmacia diferente, informe a nuestra   oficina a travs de MyChart de [place] o por telfono llamando al [phone] y presione la opcin 4.  

## 2022-04-29 ENCOUNTER — Encounter: Payer: Self-pay | Admitting: Dermatology

## 2022-05-04 ENCOUNTER — Telehealth: Payer: Self-pay

## 2022-05-04 NOTE — Telephone Encounter (Addendum)
Discussed results will patient. She verbalized understanding and denied further questions at this time. Scheduled for follow up appointment.     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 On the spectrum from normal mole to melanoma skin cancer, this is in between the two. - We need to recheck this area sometime in the next 6 months to be sure there is no evidence of the atypical mole coming back. If there is any color coming back, we would recommend repeating the biopsy to be sure the cells look normal.  - People who have a history of atypical moles do have a slightly increased risk of developing melanoma somewhere on the body, so a yearly full body skin exam by a dermatologist is recommended.  - Monthly self skin checks and daily sun protection are also recommended.  - Please call if you notice a dark spot coming back where this biopsy was taken.  - Please also call if you notice any new or changing spots anywhere else on the body before your follow-up visit.    MAs please call. Thank you!

## 2022-05-11 ENCOUNTER — Other Ambulatory Visit: Payer: Self-pay

## 2022-05-12 ENCOUNTER — Other Ambulatory Visit: Payer: Self-pay

## 2022-05-24 ENCOUNTER — Other Ambulatory Visit: Payer: Self-pay

## 2022-05-24 MED ORDER — CLOTRIMAZOLE 1 % EX CREA: TOPICAL_CREAM | Freq: Two times a day (BID) | CUTANEOUS | 1 refills | Status: AC

## 2022-05-27 ENCOUNTER — Ambulatory Visit: Payer: Managed Care, Other (non HMO) | Admitting: Dermatology

## 2022-05-27 ENCOUNTER — Other Ambulatory Visit: Payer: Self-pay

## 2022-05-27 VITALS — BP 110/75 | HR 79

## 2022-05-27 DIAGNOSIS — H01119 Allergic dermatitis of unspecified eye, unspecified eyelid: Secondary | ICD-10-CM

## 2022-05-27 MED ORDER — HYDROCORTISONE 2.5 % EX CREA
TOPICAL_CREAM | CUTANEOUS | 2 refills | Status: AC
  Filled 2022-05-27: qty 30, 14d supply, fill #0

## 2022-05-27 NOTE — Progress Notes (Signed)
   Follow-Up Visit   Subjective  Jillian Stafford is a 52 y.o. female who presents for the following: Rash (Check a rash around her eyes that will come for 6 weeks now, first stared patient tried a new make up once, she stopped using the new make up when this rash came up, patient started Prednisone 4 day dose pack she already had a home she is on day 2 which is helping,).   The following portions of the chart were reviewed this encounter and updated as appropriate:   Tobacco  Allergies  Meds  Problems  Med Hx  Surg Hx  Fam Hx      Review of Systems:  No other skin or systemic complaints except as noted in HPI or Assessment and Plan.  Objective  Well appearing patient in no apparent distress; mood and affect are within normal limits.  A focused examination was performed including face. Relevant physical exam findings are noted in the Assessment and Plan.  eyelids pink xerotic patches     Assessment & Plan  Eyelid dermatitis, allergic/contact eyelids  Start Hydrocortisone 2.5% cream apply to affected eyelids twice a day for up to 2 weeks. If this does not clear within 2 weeks, she will call and we can prescribe pimecrolimus.  Topical steroids (such as triamcinolone, fluocinolone, fluocinonide, mometasone, clobetasol, halobetasol, betamethasone, hydrocortisone) can cause thinning and lightening of the skin if they are used for too long in the same area. Your physician has selected the right strength medicine for your problem and area affected on the body. Please use your medication only as directed by your physician to prevent side effects.    Discussed with patient she may try a trial allergy testing on the back of her wrist, apply the make-up to her wrist to see if her skin reactions to the make-up  May consider patch testing in the future if rash continues    Return if symptoms worsen or fail to improve.  I, Marye Round, CMA, am acting as scribe for Forest Gleason, MD .    Documentation: I have reviewed the above documentation for accuracy and completeness, and I agree with the above.  Forest Gleason, MD

## 2022-05-27 NOTE — Patient Instructions (Addendum)
Skin safe products. Com   Due to recent changes in healthcare laws, you may see results of your pathology and/or laboratory studies on MyChart before the doctors have had a chance to review them. We understand that in some cases there may be results that are confusing or concerning to you. Please understand that not all results are received at the same time and often the doctors may need to interpret multiple results in order to provide you with the best plan of care or course of treatment. Therefore, we ask that you please give [place] 2 business days to thoroughly review all your results before contacting the office for clarification. Should we see a critical lab result, you will be contacted sooner.   If You Need Anything After Your Visit  If you have any questions or concerns for your doctor, please call our main line at [phone] and press option 4 to reach your doctor's medical assistant. If no one answers, please leave a voicemail as directed and we will return your call as soon as possible. Messages left after 4 pm will be answered the following business day.   You may also send [place] a message via [place]. We typically respond to MyChart messages within 1-2 business days.  For prescription refills, please ask your pharmacy to contact our office. Our fax number is [phone].  If you have an urgent issue when the clinic is closed that cannot wait until the next business day, you can page your doctor at the number below.    Please note that while we do our best to be available for urgent issues outside of office hours, we are not available 24/7.   If you have an urgent issue and are unable to reach [place], you may choose to seek medical care at your doctor's office, retail clinic, urgent care center, or emergency room.  If you have a medical emergency, please immediately call 911 or go to the emergency department.  Pager Numbers  - Dr. Nehemiah Massed: [phone]  - Dr. Laurence Ferrari: [phone]  -  Dr. Nicole Kindred: [phone]  In the event of inclement weather, please call our main line at [phone] for an update on the status of any delays or closures.  Dermatology Medication Tips: Please keep the boxes that topical medications come in in order to help keep track of the instructions about where and how to use these. Pharmacies typically print the medication instructions only on the boxes and not directly on the medication tubes.   If your medication is too expensive, please contact our office at [phone] option 4 or send [place] a message through [place].   We are unable to tell what your co-pay for medications will be in advance as this is different depending on your insurance coverage. However, we may be able to find a substitute medication at lower cost or fill out paperwork to get insurance to cover a needed medication.   If a prior authorization is required to get your medication covered by your insurance company, please allow [place] 1-2 business days to complete this process.  Drug prices often vary depending on where the prescription is filled and some pharmacies may offer cheaper prices.  The website www.goodrx.com contains coupons for medications through different pharmacies. The prices here do not account for what the cost may be with help from insurance (it may be cheaper with your insurance), but the website can give you the price if you did not use any insurance.  - You can print the  associated coupon and take it with your prescription to the pharmacy.  - You may also stop by our office during regular business hours and pick up a GoodRx coupon card.  - If you need your prescription sent electronically to a different pharmacy, notify our office through [facility] or by phone at [phone] option 4.     Si Usted Necesita Algo Despus de Su Visita  Tambin puede enviarnos un mensaje a travs de Pharmacist, community. Por lo general respondemos a los mensajes de MyChart en el  transcurso de 1 a 2 das hbiles.  Para renovar recetas, por favor pida a su farmacia que se ponga en contacto con nuestra oficina. Harland Dingwall de fax es [place] [phone].  Si tiene un asunto urgente cuando la clnica est cerrada y que no puede esperar hasta el siguiente da hbil, puede llamar/localizar a su doctor(a) al nmero que aparece a continuacin.   Por favor, tenga en cuenta que aunque hacemos todo lo posible para estar disponibles para asuntos urgentes fuera del horario de [place], no estamos disponibles las 24 horas del da, los 7 das de la [place].   Si tiene un problema urgente y no puede comunicarse con nosotros, puede optar por buscar atencin mdica  en el consultorio de su doctor(a), en una clnica privada, en un centro de atencin urgente o en una sala de emergencias.  Si tiene Engineering geologist, por favor llame inmediatamente al 911 o vaya a la sala de emergencias.  Nmeros de bper  - Dr. Nehemiah Massed: [phone]  - Dra. Moye: [phone]  - Dra. Nicole Kindred: [phone]  En caso de inclemencias del [place], por favor llame a Johnsie Kindred principal al [phone] para una actualizacin sobre el [place] de cualquier retraso o cierre.  Consejos para la medicacin en dermatologa: Por favor, guarde las cajas en las que vienen los medicamentos de uso tpico para ayudarle a seguir las instrucciones sobre dnde y cmo usarlos. Las farmacias generalmente imprimen las instrucciones del medicamento slo en las cajas y no directamente en los tubos del [place].   Si su medicamento es muy caro, por favor, pngase en contacto con Zigmund Daniel llamando al [phone] y presione la opcin 4 o envenos un mensaje a travs de Pharmacist, community.   No podemos decirle cul ser su copago por los medicamentos por adelantado ya que esto es diferente dependiendo de la cobertura de su seguro. Sin embargo, es posible que podamos encontrar un medicamento sustituto a Electrical engineer un  formulario para que el seguro cubra el medicamento que se considera necesario.   Si se requiere una autorizacin previa para que su compaa de seguros [place] su medicamento, por favor permtanos de 1 a 2 das hbiles para completar este proceso.  Los precios de los medicamentos varan con frecuencia dependiendo del Environmental consultant de dnde se surte la receta y alguna farmacias pueden ofrecer precios ms baratos.  El sitio web www.goodrx.com tiene cupones para medicamentos de Airline pilot. Los precios aqu no tienen en cuenta lo que podra costar con la ayuda del seguro (puede ser ms barato con su seguro), pero el sitio web puede darle el precio si no utiliz Research scientist (physical sciences).  - Puede imprimir el cupn correspondiente y llevarlo con su receta a la farmacia.  - Tambin puede pasar por nuestra oficina durante el horario de atencin regular y Charity fundraiser una tarjeta de cupones de GoodRx.  - Si necesita que su receta se enve electrnicamente a Chiropodist, informe a nuestra oficina a  Lawerance Cruel de MyChart de  o por telfono llamando al [phone] y presione la opcin 4.

## 2022-05-31 ENCOUNTER — Other Ambulatory Visit: Payer: Self-pay

## 2022-05-31 MED ORDER — PREDNISONE 10 MG PO TABS
ORAL_TABLET | ORAL | 0 refills | Status: AC
  Filled 2022-05-31: qty 20, 8d supply, fill #0

## 2022-05-31 MED ORDER — CYCLOBENZAPRINE HCL 10 MG PO TABS
10.0000 mg | ORAL_TABLET | Freq: Three times a day (TID) | ORAL | 3 refills | Status: AC | PRN
  Filled 2022-05-31: qty 21, 7d supply, fill #0
  Filled 2023-04-29: qty 21, 7d supply, fill #1
  Filled 2023-05-31: qty 21, 7d supply, fill #2

## 2022-06-01 ENCOUNTER — Encounter: Payer: Self-pay | Admitting: Dermatology

## 2022-06-09 ENCOUNTER — Other Ambulatory Visit: Payer: Self-pay

## 2022-06-23 ENCOUNTER — Encounter: Payer: Self-pay | Admitting: Dermatology

## 2022-06-23 DIAGNOSIS — R21 Rash and other nonspecific skin eruption: Secondary | ICD-10-CM

## 2022-06-23 NOTE — Telephone Encounter (Signed)
I would recommend starting pimecrolimus cream twice a day as needed (can be used long-term). I would also recommend we do patch testing for her to look for ingredients she may be allergic to.  She can also try switching to only vanicream for her skin care line as this is hypoallergenic. Sometimes eyelid rashes like this can be caused by an allergy to gold jewelry as well which is something we would test her for with the patch testing.

## 2022-06-24 NOTE — Telephone Encounter (Signed)
Please advise ok to use pimecrolimus and send in. Please schedule for patch testing or have from desk schedule for patch testing next week. Thank you!

## 2022-06-25 ENCOUNTER — Other Ambulatory Visit: Payer: Self-pay

## 2022-06-28 ENCOUNTER — Other Ambulatory Visit: Payer: Self-pay

## 2022-06-28 MED ORDER — PIMECROLIMUS 1 % EX CREA
TOPICAL_CREAM | Freq: Two times a day (BID) | CUTANEOUS | 0 refills | Status: AC
  Filled 2022-06-28: qty 60, 20d supply, fill #0

## 2022-06-29 ENCOUNTER — Other Ambulatory Visit: Payer: Self-pay

## 2022-07-06 ENCOUNTER — Other Ambulatory Visit: Payer: Self-pay

## 2022-07-19 ENCOUNTER — Ambulatory Visit: Payer: Managed Care, Other (non HMO)

## 2022-07-21 ENCOUNTER — Ambulatory Visit: Payer: Managed Care, Other (non HMO)

## 2022-07-27 ENCOUNTER — Ambulatory Visit: Payer: Managed Care, Other (non HMO) | Admitting: Dermatology

## 2022-08-02 ENCOUNTER — Ambulatory Visit (INDEPENDENT_AMBULATORY_CARE_PROVIDER_SITE_OTHER): Payer: Managed Care, Other (non HMO)

## 2022-08-02 DIAGNOSIS — L501 Idiopathic urticaria: Secondary | ICD-10-CM

## 2022-08-02 DIAGNOSIS — L309 Dermatitis, unspecified: Secondary | ICD-10-CM

## 2022-08-03 ENCOUNTER — Ambulatory Visit: Payer: Managed Care, Other (non HMO) | Admitting: Dermatology

## 2022-08-03 NOTE — Progress Notes (Signed)
Patient here today for day I patch test application. Testing area prepped and patches applied to left upper back. Patient encouraged to not soak or scrub the testing area. Patient to return to clinic on Wednesday for removal and first reading. All questions and concerns answered.

## 2022-08-04 ENCOUNTER — Ambulatory Visit (INDEPENDENT_AMBULATORY_CARE_PROVIDER_SITE_OTHER): Payer: Managed Care, Other (non HMO)

## 2022-08-04 DIAGNOSIS — R21 Rash and other nonspecific skin eruption: Secondary | ICD-10-CM | POA: Diagnosis not present

## 2022-08-04 NOTE — Progress Notes (Signed)
Patient here today for two day patch test reading. Patient had no signs of any reactions.  Patient advised how to read testing Thursday - Sunday and no soaking nor scrubbing area.   Dorathy Daft, RMA

## 2022-08-05 ENCOUNTER — Ambulatory Visit: Payer: Managed Care, Other (non HMO) | Admitting: Dermatology

## 2022-08-05 NOTE — Addendum Note (Signed)
Addended by: Sandi Mealy on: 08/05/2022 11:07 PM   Modules accepted: Level of Service

## 2022-08-09 ENCOUNTER — Other Ambulatory Visit: Payer: Self-pay

## 2022-08-09 MED ORDER — WEGOVY 2.4 MG/0.75ML ~~LOC~~ SOAJ
SUBCUTANEOUS | 3 refills | Status: DC
  Filled 2022-08-09: qty 3, 28d supply, fill #0
  Filled 2022-09-03: qty 3, 28d supply, fill #1
  Filled 2022-09-28: qty 3, 28d supply, fill #2
  Filled 2022-11-01 – 2022-11-16 (×5): qty 3, 28d supply, fill #3

## 2022-08-12 ENCOUNTER — Ambulatory Visit: Payer: Managed Care, Other (non HMO) | Admitting: Dermatology

## 2022-09-02 ENCOUNTER — Other Ambulatory Visit: Payer: Self-pay

## 2022-09-02 MED ORDER — CIPROFLOXACIN HCL 500 MG PO TABS
500.0000 mg | ORAL_TABLET | Freq: Two times a day (BID) | ORAL | 0 refills | Status: DC
  Filled 2022-09-02: qty 10, 5d supply, fill #0

## 2022-09-03 ENCOUNTER — Other Ambulatory Visit: Payer: Self-pay

## 2022-09-21 ENCOUNTER — Other Ambulatory Visit: Payer: Self-pay

## 2022-09-21 MED ORDER — RABEPRAZOLE SODIUM 20 MG PO TBEC
20.0000 mg | DELAYED_RELEASE_TABLET | Freq: Every day | ORAL | 11 refills | Status: DC
  Filled 2022-09-21: qty 30, 30d supply, fill #0

## 2022-09-21 MED ORDER — LINZESS 72 MCG PO CAPS
72.0000 ug | ORAL_CAPSULE | Freq: Every day | ORAL | 11 refills | Status: DC
  Filled 2022-09-21: qty 30, 30d supply, fill #0

## 2022-09-22 ENCOUNTER — Other Ambulatory Visit: Payer: Self-pay | Admitting: Gastroenterology

## 2022-09-22 DIAGNOSIS — K589 Irritable bowel syndrome without diarrhea: Secondary | ICD-10-CM

## 2022-09-22 DIAGNOSIS — K581 Irritable bowel syndrome with constipation: Secondary | ICD-10-CM

## 2022-09-22 DIAGNOSIS — K900 Celiac disease: Secondary | ICD-10-CM

## 2022-09-24 ENCOUNTER — Other Ambulatory Visit: Payer: Self-pay

## 2022-09-28 ENCOUNTER — Other Ambulatory Visit: Payer: Self-pay

## 2022-09-28 MED ORDER — PANTOPRAZOLE SODIUM 40 MG PO TBEC
40.0000 mg | DELAYED_RELEASE_TABLET | Freq: Every day | ORAL | 11 refills | Status: AC
  Filled 2022-09-28: qty 30, 30d supply, fill #0
  Filled 2022-10-27: qty 30, 30d supply, fill #1

## 2022-09-29 ENCOUNTER — Other Ambulatory Visit: Payer: Self-pay

## 2022-09-29 MED ORDER — ALBUTEROL SULFATE HFA 108 (90 BASE) MCG/ACT IN AERS
2.0000 | INHALATION_SPRAY | RESPIRATORY_TRACT | 2 refills | Status: DC | PRN
  Filled 2022-09-29: qty 8.5, 16d supply, fill #0
  Filled 2022-11-12: qty 6.7, 25d supply, fill #1
  Filled 2023-07-18: qty 6.7, 25d supply, fill #2

## 2022-10-19 ENCOUNTER — Other Ambulatory Visit: Payer: Self-pay | Admitting: Gastroenterology

## 2022-10-19 ENCOUNTER — Ambulatory Visit
Admission: RE | Admit: 2022-10-19 | Discharge: 2022-10-19 | Disposition: A | Discharge status: Home / Self Care | Payer: Managed Care, Other (non HMO) | Source: Ambulatory Visit | Attending: Gastroenterology | Admitting: Gastroenterology

## 2022-10-19 DIAGNOSIS — K589 Irritable bowel syndrome without diarrhea: Secondary | ICD-10-CM

## 2022-10-19 DIAGNOSIS — K581 Irritable bowel syndrome with constipation: Secondary | ICD-10-CM

## 2022-10-19 DIAGNOSIS — K900 Celiac disease: Secondary | ICD-10-CM

## 2022-10-27 ENCOUNTER — Other Ambulatory Visit: Payer: Self-pay

## 2022-11-01 ENCOUNTER — Other Ambulatory Visit: Payer: Self-pay

## 2022-11-02 ENCOUNTER — Other Ambulatory Visit: Payer: Self-pay

## 2022-11-02 MED ORDER — CIPROFLOXACIN HCL 500 MG PO TABS
500.0000 mg | ORAL_TABLET | Freq: Two times a day (BID) | ORAL | 0 refills | Status: AC
  Filled 2022-11-02: qty 14, 7d supply, fill #0

## 2022-11-03 ENCOUNTER — Other Ambulatory Visit: Payer: Self-pay

## 2022-11-03 MED ORDER — FLUCONAZOLE 150 MG PO TABS
150.0000 mg | ORAL_TABLET | Freq: Once | ORAL | 0 refills | Status: AC
  Filled 2022-11-03: qty 1, 1d supply, fill #0

## 2022-11-05 ENCOUNTER — Other Ambulatory Visit: Payer: Self-pay

## 2022-11-08 ENCOUNTER — Other Ambulatory Visit: Payer: Self-pay

## 2022-11-08 MED ORDER — FLUCONAZOLE 150 MG PO TABS
150.0000 mg | ORAL_TABLET | Freq: Once | ORAL | 0 refills | Status: AC
  Filled 2022-11-08: qty 1, 1d supply, fill #0

## 2022-11-10 ENCOUNTER — Other Ambulatory Visit: Payer: Self-pay

## 2022-11-12 ENCOUNTER — Other Ambulatory Visit: Payer: Self-pay

## 2022-11-16 ENCOUNTER — Other Ambulatory Visit: Payer: Self-pay

## 2022-11-22 ENCOUNTER — Other Ambulatory Visit: Payer: Self-pay

## 2022-11-22 MED ORDER — PANTOPRAZOLE SODIUM 40 MG PO TBEC
40.0000 mg | DELAYED_RELEASE_TABLET | Freq: Every day | ORAL | 3 refills | Status: DC
  Filled 2022-11-22: qty 90, 90d supply, fill #0
  Filled 2023-02-25: qty 90, 90d supply, fill #1
  Filled 2023-05-31: qty 90, 90d supply, fill #2
  Filled 2023-08-30 (×2): qty 90, 90d supply, fill #3

## 2022-11-30 ENCOUNTER — Other Ambulatory Visit: Payer: Self-pay | Admitting: Internal Medicine

## 2022-11-30 DIAGNOSIS — Z1231 Encounter for screening mammogram for malignant neoplasm of breast: Secondary | ICD-10-CM

## 2022-12-08 ENCOUNTER — Other Ambulatory Visit: Payer: Self-pay

## 2022-12-09 ENCOUNTER — Other Ambulatory Visit: Payer: Self-pay

## 2022-12-09 ENCOUNTER — Ambulatory Visit
Admission: RE | Admit: 2022-12-09 | Discharge: 2022-12-09 | Disposition: A | Discharge status: Home / Self Care | Payer: Managed Care, Other (non HMO) | Source: Ambulatory Visit | Attending: Internal Medicine | Admitting: Internal Medicine

## 2022-12-09 DIAGNOSIS — Z1231 Encounter for screening mammogram for malignant neoplasm of breast: Secondary | ICD-10-CM | POA: Diagnosis present

## 2022-12-09 MED ORDER — WEGOVY 2.4 MG/0.75ML ~~LOC~~ SOAJ
2.4000 mg | SUBCUTANEOUS | 3 refills | Status: DC
  Filled 2022-12-09: qty 3, 28d supply, fill #0
  Filled 2023-01-02: qty 3, 28d supply, fill #1
  Filled 2023-01-31: qty 3, 28d supply, fill #2
  Filled 2023-02-25: qty 3, 28d supply, fill #3

## 2022-12-14 ENCOUNTER — Other Ambulatory Visit: Payer: Self-pay

## 2022-12-17 ENCOUNTER — Other Ambulatory Visit: Payer: Self-pay

## 2022-12-17 ENCOUNTER — Encounter: Payer: Self-pay | Admitting: Pharmacist

## 2023-01-31 ENCOUNTER — Other Ambulatory Visit: Payer: Self-pay

## 2023-02-25 ENCOUNTER — Other Ambulatory Visit: Payer: Self-pay

## 2023-04-29 ENCOUNTER — Other Ambulatory Visit: Payer: Self-pay

## 2023-04-29 ENCOUNTER — Other Ambulatory Visit (HOSPITAL_COMMUNITY): Payer: Self-pay

## 2023-04-29 MED ORDER — FLUTICASONE-SALMETEROL 100-50 MCG/ACT IN AEPB
1.0000 | INHALATION_SPRAY | Freq: Two times a day (BID) | RESPIRATORY_TRACT | 11 refills | Status: AC
  Filled 2023-04-29: qty 60, 30d supply, fill #0

## 2023-05-05 ENCOUNTER — Encounter: Payer: Managed Care, Other (non HMO) | Admitting: Dermatology

## 2023-05-09 ENCOUNTER — Other Ambulatory Visit: Payer: Self-pay

## 2023-05-09 MED ORDER — CYCLOBENZAPRINE HCL 10 MG PO TABS
10.0000 mg | ORAL_TABLET | Freq: Two times a day (BID) | ORAL | 1 refills | Status: AC | PRN
  Filled 2023-05-09 – 2023-09-22 (×2): qty 90, 45d supply, fill #0
  Filled 2024-05-02: qty 90, 45d supply, fill #1

## 2023-05-09 MED ORDER — FLUTICASONE-SALMETEROL 100-50 MCG/ACT IN AEPB
1.0000 | INHALATION_SPRAY | Freq: Two times a day (BID) | RESPIRATORY_TRACT | 11 refills | Status: AC
  Filled 2023-05-09 – 2023-07-18 (×2): qty 60, 30d supply, fill #0
  Filled 2023-08-30: qty 60, 30d supply, fill #1
  Filled 2024-02-01: qty 60, 30d supply, fill #2
  Filled 2024-05-03: qty 60, 30d supply, fill #3

## 2023-05-09 MED ORDER — CYCLOBENZAPRINE HCL 10 MG PO TABS
10.0000 mg | ORAL_TABLET | Freq: Two times a day (BID) | ORAL | 1 refills | Status: DC | PRN
  Filled 2023-05-09: qty 60, 30d supply, fill #0

## 2023-05-31 ENCOUNTER — Other Ambulatory Visit: Payer: Self-pay

## 2023-07-18 ENCOUNTER — Other Ambulatory Visit: Payer: Self-pay

## 2023-07-19 ENCOUNTER — Other Ambulatory Visit: Payer: Self-pay

## 2023-08-30 ENCOUNTER — Other Ambulatory Visit: Payer: Self-pay

## 2023-09-22 ENCOUNTER — Other Ambulatory Visit: Payer: Self-pay

## 2023-11-08 ENCOUNTER — Ambulatory Visit: Payer: Managed Care, Other (non HMO) | Admitting: Dermatology

## 2023-11-10 ENCOUNTER — Other Ambulatory Visit: Payer: Self-pay | Admitting: Internal Medicine

## 2023-11-10 ENCOUNTER — Other Ambulatory Visit: Payer: Self-pay

## 2023-11-10 DIAGNOSIS — Z1231 Encounter for screening mammogram for malignant neoplasm of breast: Secondary | ICD-10-CM

## 2023-11-16 ENCOUNTER — Other Ambulatory Visit: Payer: Self-pay

## 2023-11-16 MED ORDER — PANTOPRAZOLE SODIUM 40 MG PO TBEC
40.0000 mg | DELAYED_RELEASE_TABLET | Freq: Every day | ORAL | 1 refills | Status: DC
  Filled 2023-11-16: qty 90, 90d supply, fill #0
  Filled 2024-02-01: qty 90, 90d supply, fill #1

## 2023-11-16 MED ORDER — FLUTICASONE-SALMETEROL 100-50 MCG/ACT IN AEPB
1.0000 | INHALATION_SPRAY | Freq: Two times a day (BID) | RESPIRATORY_TRACT | 2 refills | Status: AC
  Filled 2023-11-16: qty 60, 30d supply, fill #0

## 2023-11-18 ENCOUNTER — Other Ambulatory Visit: Payer: Self-pay

## 2023-12-14 ENCOUNTER — Ambulatory Visit
Admission: RE | Admit: 2023-12-14 | Discharge: 2023-12-14 | Disposition: A | Discharge status: Home / Self Care | Source: Ambulatory Visit | Attending: Internal Medicine | Admitting: Internal Medicine

## 2023-12-14 ENCOUNTER — Other Ambulatory Visit: Payer: Self-pay | Admitting: Internal Medicine

## 2023-12-14 DIAGNOSIS — Z1231 Encounter for screening mammogram for malignant neoplasm of breast: Secondary | ICD-10-CM | POA: Diagnosis present

## 2024-02-01 ENCOUNTER — Other Ambulatory Visit: Payer: Self-pay

## 2024-02-01 MED ORDER — ALBUTEROL SULFATE HFA 108 (90 BASE) MCG/ACT IN AERS
2.0000 | INHALATION_SPRAY | RESPIRATORY_TRACT | 2 refills | Status: AC | PRN
  Filled 2024-02-01: qty 8.5, 32d supply, fill #0

## 2024-02-08 ENCOUNTER — Other Ambulatory Visit: Payer: Self-pay

## 2024-02-08 MED ORDER — AMOXICILLIN-POT CLAVULANATE 875-125 MG PO TABS
1.0000 | ORAL_TABLET | Freq: Two times a day (BID) | ORAL | 0 refills | Status: AC
  Filled 2024-02-08: qty 14, 7d supply, fill #0

## 2024-02-09 ENCOUNTER — Other Ambulatory Visit: Payer: Self-pay

## 2024-02-09 MED ORDER — PREDNISONE 20 MG PO TABS
20.0000 mg | ORAL_TABLET | Freq: Every day | ORAL | 0 refills | Status: AC
  Filled 2024-02-09: qty 5, 5d supply, fill #0

## 2024-03-06 ENCOUNTER — Ambulatory Visit: Admitting: Dermatology

## 2024-03-06 ENCOUNTER — Other Ambulatory Visit: Payer: Self-pay

## 2024-03-06 DIAGNOSIS — Z86018 Personal history of other benign neoplasm: Secondary | ICD-10-CM

## 2024-03-06 DIAGNOSIS — L300 Nummular dermatitis: Secondary | ICD-10-CM

## 2024-03-06 DIAGNOSIS — L578 Other skin changes due to chronic exposure to nonionizing radiation: Secondary | ICD-10-CM

## 2024-03-06 DIAGNOSIS — L814 Other melanin hyperpigmentation: Secondary | ICD-10-CM

## 2024-03-06 DIAGNOSIS — Z1283 Encounter for screening for malignant neoplasm of skin: Secondary | ICD-10-CM

## 2024-03-06 DIAGNOSIS — D225 Melanocytic nevi of trunk: Secondary | ICD-10-CM

## 2024-03-06 DIAGNOSIS — D1801 Hemangioma of skin and subcutaneous tissue: Secondary | ICD-10-CM

## 2024-03-06 DIAGNOSIS — W908XXA Exposure to other nonionizing radiation, initial encounter: Secondary | ICD-10-CM

## 2024-03-06 DIAGNOSIS — D229 Melanocytic nevi, unspecified: Secondary | ICD-10-CM

## 2024-03-06 DIAGNOSIS — L821 Other seborrheic keratosis: Secondary | ICD-10-CM

## 2024-03-06 MED ORDER — MOMETASONE FUROATE 0.1 % EX CREA
TOPICAL_CREAM | CUTANEOUS | 1 refills | Status: AC
  Filled 2024-03-06: qty 15, 30d supply, fill #0

## 2024-03-06 NOTE — Patient Instructions (Addendum)
 Recommend starting moisturizer with exfoliant (Urea, Salicylic acid, or Lactic acid) one to two times daily to help smooth rough and bumpy skin.  OTC options include Cetaphil Rough and Bumpy lotion (Urea), Eucerin Roughness Relief lotion or spot treatment cream (Urea), CeraVe SA lotion/cream for Rough and Bumpy skin (Sal Acid), Gold Bond Rough and Bumpy cream (Sal Acid), and AmLactin 12% lotion/cream (Lactic Acid).  If applying in morning, also apply sunscreen to sun-exposed areas, since these exfoliating moisturizers can increase sensitivity to sun.   Gentle Skin Care Guide  1. Bathe no more than once a day.  2. Avoid bathing in hot water  3. Use a mild soap like Dove, Vanicream, Cetaphil, CeraVe. Can use Lever 2000 or Cetaphil antibacterial soap  4. Use soap only where you need it. On most days, use it under your arms, between your legs, and on your feet. Let the water rinse other areas unless visibly dirty.  5. When you get out of the bath/shower, use a towel to gently blot your skin dry, don't rub it.  6. While your skin is still a little damp, apply a moisturizing cream such as Vanicream, CeraVe, Cetaphil, Eucerin, Sarna lotion or plain Vaseline Jelly. For hands apply Neutrogena Norwegian Hand Cream or Excipial Hand Cream.  7. Reapply moisturizer any time you start to itch or feel dry.  8. Sometimes using free and clear laundry detergents can be helpful. Fabric softener sheets should be avoided. Downy Free & Gentle liquid, or any liquid fabric softener that is free of dyes and perfumes, it acceptable to use  9. If your doctor has given you prescription creams you may apply moisturizers over them      Due to recent changes in healthcare laws, you may see results of your pathology and/or laboratory studies on MyChart before the doctors have had a chance to review them. We understand that in some cases there may be results that are confusing or concerning to you. Please understand that not  all results are received at the same time and often the doctors may need to interpret multiple results in order to provide you with the best plan of care or course of treatment. Therefore, we ask that you please give us  2 business days to thoroughly review all your results before contacting the office for clarification. Should we see a critical lab result, you will be contacted sooner.   If You Need Anything After Your Visit  If you have any questions or concerns for your doctor, please call our main line at [phone] and press option 4 to reach your doctor's medical assistant. If no one answers, please leave a voicemail as directed and we will return your call as soon as possible. Messages left after 4 pm will be answered the following business day.   You may also send us  a message via MyChart. We typically respond to MyChart messages within 1-2 business days.  For prescription refills, please ask your pharmacy to contact our office. Our fax number is [phone].  If you have an urgent issue when the clinic is closed that cannot wait until the next business day, you can page your doctor at the number below.    Please note that while we do our best to be available for urgent issues outside of office hours, we are not available 24/7.   If you have an urgent issue and are unable to reach us , you may choose to seek medical care at your doctor's office, retail clinic, urgent  care center, or emergency room.  If you have a medical emergency, please immediately call 911 or go to the emergency department.  Pager Numbers  - Dr. Hester: [phone]  - Dr. Jackquline: [phone]  - Dr. Claudene: [phone]   - Dr. Raymund: [phone]  In the event of inclement weather, please call our main line at [phone] for an update on the status of any delays or closures.  Dermatology Medication Tips: Please keep the boxes that topical medications come in in order to help keep track of the  instructions about where and how to use these. Pharmacies typically print the medication instructions only on the boxes and not directly on the medication tubes.   If your medication is too expensive, please contact our office at [phone] option 4 or send us  a message through MyChart.   We are unable to tell what your co-pay for medications will be in advance as this is different depending on your insurance coverage. However, we may be able to find a substitute medication at lower cost or fill out paperwork to get insurance to cover a needed medication.   If a prior authorization is required to get your medication covered by your insurance company, please allow us  1-2 business days to complete this process.  Drug prices often vary depending on where the prescription is filled and some pharmacies may offer cheaper prices.  The website www.goodrx.com contains coupons for medications through different pharmacies. The prices here do not account for what the cost may be with help from insurance (it may be cheaper with your insurance), but the website can give you the price if you did not use any insurance.  - You can print the associated coupon and take it with your prescription to the pharmacy.  - You may also stop by our office during regular business hours and pick up a GoodRx coupon card.  - If you need your prescription sent electronically to a different pharmacy, notify our office through [facility] or by phone at [phone] option 4.     Si Usted Necesita Algo Despus de Su Visita  Tambin puede enviarnos un mensaje a travs de Clinical Cytogeneticist. Por lo general respondemos a los mensajes de MyChart en el transcurso de 1 a 2 das hbiles.  Para renovar recetas, por favor pida a su farmacia que se ponga en contacto con nuestra oficina. Randi lakes de fax es [place] [phone].  Si tiene un asunto urgente cuando la clnica est cerrada y que no puede esperar hasta el siguiente da hbil,  puede llamar/localizar a su doctor(a) al nmero que aparece a continuacin.   Por favor, tenga en cuenta que aunque hacemos todo lo posible para estar disponibles para asuntos urgentes fuera del horario de [place], no estamos disponibles las 24 horas del da, los 7 [address]  [address] de la [place].   Si tiene un problema urgente y no puede comunicarse con nosotros, puede optar por buscar atencin mdica  en el consultorio de su doctor(a), en una clnica privada, en un centro de atencin urgente o en una sala de emergencias.  Si tiene engineer, drilling, por favor llame inmediatamente al 911 o vaya a la sala de emergencias.  Nmeros de bper  - Dr. Hester: [phone]  - Dra. Jackquline: [phone]  - Dr. Claudene: [phone]  - Dra. Kitts: [phone]  En caso de inclemencias del [place], por favor llame a nuestra lnea principal al [phone] para una actualizacin sobre el estado de cualquier retraso o cierre.  Consejos  para tourist information centre manager en dermatologa: Por favor, guarde las cajas en las que vienen los medicamentos de uso tpico para ayudarle a seguir las instrucciones sobre dnde y cmo usarlos. Las farmacias generalmente imprimen las instrucciones del medicamento slo en las cajas y no directamente en los tubos del [place].   Si su medicamento es muy caro, por favor, pngase en contacto con landry rieger llamando al [phone] y presione la opcin 4 o envenos un mensaje a travs de Clinical Cytogeneticist.   No podemos decirle cul ser su copago por los medicamentos por adelantado ya que esto es diferente dependiendo de la cobertura de su seguro. Sin embargo, es posible que podamos encontrar un medicamento sustituto a audiological scientist un formulario para que el seguro cubra el medicamento que se considera necesario.   Si se requiere una autorizacin previa para que su compaa de seguros cubra su medicamento, por favor permtanos de 1 a 2 das hbiles para completar este proceso.  Los precios  de los medicamentos varan con frecuencia dependiendo del environmental consultant de dnde se surte la receta y alguna farmacias pueden ofrecer precios ms baratos.  El sitio web www.goodrx.com tiene cupones para medicamentos de health and safety inspector. Los precios aqu no tienen en cuenta lo que podra costar con la ayuda del seguro (puede ser ms barato con su seguro), pero el sitio web puede darle el precio si no utiliz tourist information centre manager.  - Puede imprimir el cupn correspondiente y llevarlo con su receta a la farmacia.  - Tambin puede pasar por nuestra oficina durante el horario de atencin regular y education officer, museum una tarjeta de cupones de GoodRx.  - Si necesita que su receta se enve electrnicamente a una farmacia diferente, informe a nuestra oficina a travs de MyChart de [place] o por telfono llamando al [phone] y presione la opcin 4.

## 2024-03-06 NOTE — Progress Notes (Signed)
 Follow-Up Visit   Subjective  Jillian Stafford is a 53 y.o. female who presents for the following: Skin Cancer Screening and Full Body Skin Exam.   The patient presents for Total-Body Skin Exam (TBSE) for skin cancer screening and mole check. The patient has spots, moles and lesions to be evaluated, some may be new or changing, she has 2 spots on lower left leg, itchy at times x few months. Hx of Dysplastic nevi.   The following portions of the chart were reviewed this encounter and updated as appropriate: medications, allergies, medical history  Review of Systems:  No other skin or systemic complaints except as noted in HPI or Assessment and Plan.  Objective  Well appearing patient in no apparent distress; mood and affect are within normal limits.  A full examination was performed including scalp, head, eyes, ears, nose, lips, neck, chest, axillae, abdomen, back, buttocks, bilateral upper extremities, bilateral lower extremities, hands, feet, fingers, toes, fingernails, and toenails. All findings within normal limits unless otherwise noted below.   Relevant physical exam findings are noted in the Assessment and Plan.           Assessment & Plan   SKIN CANCER SCREENING PERFORMED TODAY.  ACTINIC DAMAGE - Chronic condition, secondary to cumulative UV/sun exposure - diffuse scaly erythematous macules with underlying dyspigmentation - Recommend daily broad spectrum sunscreen SPF 30+ to sun-exposed areas, reapply every 2 hours as needed.  - Staying in the shade or wearing long sleeves, sun glasses (UVA+UVB protection) and wide brim hats (4-inch brim around the entire circumference of the hat) are also recommended for sun protection.  - Call for new or changing lesions.  LENTIGINES, SEBORRHEIC KERATOSES, HEMANGIOMAS - Benign normal skin lesions - Benign-appearing - Call for any changes  MELANOCYTIC NEVI - Tan/dark-brown and/or pink-flesh-colored symmetric macules and papules.  Photos today of back, buttocks. - Right Inferior Breast  0.6 cm thin medium to dark brown symmetric papule, stable compared to photo 04/28/22 - Benign appearing on exam today - Observation - Call clinic for new or changing moles - Recommend daily use of broad spectrum spf 30+ sunscreen to sun-exposed areas.   HISTORY OF DYSPLASTIC NEVI Upper mid back, severe, exc 07/13/21 Right lower abdomen, mod, 05/03/22 No evidence of recurrence today Recommend regular full body skin exams Recommend daily broad spectrum sunscreen SPF 30+ to sun-exposed areas, reapply every 2 hours as needed.  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 Condition is bothersome/symptomatic for patient. Currently flared.   Nummular dermatitis (eczema) is a chronic, relapsing, itchy rash that can significantly affect quality of life. It is often associated with dry skin and flares in the wintertime, and may require treatment with prescription topical anti-inflammatory medications, in addition to gentle skin care.  If there is associated atopic dermatitis and topicals are not working, then biologic injections may be necessary to clear rash and control symptoms.  Treatment Plan: Start mometasone  cream twice daily until clear up to 4 weeks dsp 45g 1Rf.   Topical steroids (such as triamcinolone, fluocinolone, fluocinonide, mometasone , clobetasol, halobetasol, betamethasone, hydrocortisone ) can cause thinning and lightening of the skin if they are used for too long in the same area. Your physician has selected the right strength medicine for your problem and area affected on the body. Please use your medication  only as directed by your physician to prevent side effects.    Recommend mild soap and moisturizing cream 1-2 times daily.  Gentle  skin care handout provided.      Return in about 1 year (around 03/06/2025) for TBSE, Hx Dysplastic Nevus.  IAndrea Kerns, CMA, am acting as scribe for Rexene Rattler, MD .   Documentation: I have reviewed the above documentation for accuracy and completeness, and I agree with the above.  Rexene Rattler, MD

## 2024-04-19 ENCOUNTER — Other Ambulatory Visit: Payer: Self-pay

## 2024-05-03 ENCOUNTER — Other Ambulatory Visit: Payer: Self-pay

## 2024-05-03 MED ORDER — PANTOPRAZOLE SODIUM 40 MG PO TBEC
40.0000 mg | DELAYED_RELEASE_TABLET | Freq: Every day | ORAL | 1 refills | Status: AC
  Filled 2024-05-03 (×3): qty 90, 90d supply, fill #0

## 2025-03-19 ENCOUNTER — Encounter: Admitting: Dermatology
